# Patient Record
Sex: Female | Born: 1950 | Race: White | Hispanic: No | Marital: Married | Smoking: Never smoker
Health system: Southern US, Community
[De-identification: ages and names within clinical notes are randomized; demographics above are authoritative.]

---

## 1998-02-27 ENCOUNTER — Other Ambulatory Visit: Admission: RE | Admit: 1998-02-27 | Discharge: 1998-02-27 | Payer: Self-pay | Admitting: Gynecology

## 1998-09-22 ENCOUNTER — Other Ambulatory Visit: Admission: RE | Admit: 1998-09-22 | Discharge: 1998-09-22 | Payer: Self-pay | Admitting: Surgery

## 1999-02-26 ENCOUNTER — Encounter: Admission: RE | Admit: 1999-02-26 | Discharge: 1999-02-26 | Payer: Self-pay | Admitting: Internal Medicine

## 1999-02-26 ENCOUNTER — Encounter: Payer: Self-pay | Admitting: Internal Medicine

## 1999-11-16 ENCOUNTER — Encounter: Admission: RE | Admit: 1999-11-16 | Discharge: 1999-11-16 | Payer: Self-pay | Admitting: Internal Medicine

## 1999-11-16 ENCOUNTER — Encounter: Payer: Self-pay | Admitting: Internal Medicine

## 1999-11-23 ENCOUNTER — Encounter: Payer: Self-pay | Admitting: Neurosurgery

## 1999-11-23 ENCOUNTER — Inpatient Hospital Stay (HOSPITAL_COMMUNITY): Admission: RE | Admit: 1999-11-23 | Discharge: 1999-11-24 | Payer: Self-pay | Admitting: Neurosurgery

## 1999-12-10 ENCOUNTER — Encounter: Admission: RE | Admit: 1999-12-10 | Discharge: 1999-12-10 | Payer: Self-pay | Admitting: Neurosurgery

## 1999-12-10 ENCOUNTER — Encounter: Payer: Self-pay | Admitting: Neurosurgery

## 2000-01-28 ENCOUNTER — Encounter: Admission: RE | Admit: 2000-01-28 | Discharge: 2000-01-28 | Payer: Self-pay | Admitting: Neurosurgery

## 2000-01-28 ENCOUNTER — Encounter: Payer: Self-pay | Admitting: Neurosurgery

## 2000-07-02 ENCOUNTER — Other Ambulatory Visit: Admission: RE | Admit: 2000-07-02 | Discharge: 2000-07-02 | Payer: Self-pay | Admitting: Gynecology

## 2000-07-31 ENCOUNTER — Encounter: Admission: RE | Admit: 2000-07-31 | Discharge: 2000-07-31 | Payer: Self-pay | Admitting: Internal Medicine

## 2000-07-31 ENCOUNTER — Encounter: Payer: Self-pay | Admitting: Internal Medicine

## 2001-04-07 ENCOUNTER — Encounter: Admission: RE | Admit: 2001-04-07 | Discharge: 2001-04-07 | Payer: Self-pay | Admitting: Internal Medicine

## 2001-04-07 ENCOUNTER — Encounter: Payer: Self-pay | Admitting: Internal Medicine

## 2001-10-14 ENCOUNTER — Other Ambulatory Visit: Admission: RE | Admit: 2001-10-14 | Discharge: 2001-10-14 | Payer: Self-pay | Admitting: Gynecology

## 2003-02-09 ENCOUNTER — Other Ambulatory Visit: Admission: RE | Admit: 2003-02-09 | Discharge: 2003-02-09 | Payer: Self-pay | Admitting: Gynecology

## 2003-03-25 ENCOUNTER — Encounter: Admission: RE | Admit: 2003-03-25 | Discharge: 2003-03-25 | Payer: Self-pay | Admitting: Gynecology

## 2003-04-01 ENCOUNTER — Ambulatory Visit (HOSPITAL_COMMUNITY): Admission: RE | Admit: 2003-04-01 | Discharge: 2003-04-01 | Payer: Self-pay | Admitting: Gastroenterology

## 2003-09-29 ENCOUNTER — Ambulatory Visit (HOSPITAL_COMMUNITY): Admission: RE | Admit: 2003-09-29 | Discharge: 2003-09-29 | Payer: Self-pay | Admitting: Internal Medicine

## 2003-12-22 ENCOUNTER — Encounter: Admission: RE | Admit: 2003-12-22 | Discharge: 2003-12-22 | Payer: Self-pay | Admitting: Internal Medicine

## 2004-03-08 ENCOUNTER — Other Ambulatory Visit: Admission: RE | Admit: 2004-03-08 | Discharge: 2004-03-08 | Payer: Self-pay | Admitting: Gynecology

## 2004-05-26 ENCOUNTER — Encounter: Admission: RE | Admit: 2004-05-26 | Discharge: 2004-05-26 | Payer: Self-pay | Admitting: Internal Medicine

## 2004-06-19 ENCOUNTER — Encounter: Admission: RE | Admit: 2004-06-19 | Discharge: 2004-06-19 | Payer: Self-pay | Admitting: Internal Medicine

## 2004-06-27 ENCOUNTER — Encounter: Admission: RE | Admit: 2004-06-27 | Discharge: 2004-06-27 | Payer: Self-pay | Admitting: Internal Medicine

## 2005-04-02 ENCOUNTER — Other Ambulatory Visit: Admission: RE | Admit: 2005-04-02 | Discharge: 2005-04-02 | Payer: Self-pay | Admitting: Gynecology

## 2005-09-06 ENCOUNTER — Ambulatory Visit (HOSPITAL_BASED_OUTPATIENT_CLINIC_OR_DEPARTMENT_OTHER): Admission: RE | Admit: 2005-09-06 | Discharge: 2005-09-06 | Payer: Self-pay | Admitting: Gynecology

## 2005-09-06 ENCOUNTER — Encounter (INDEPENDENT_AMBULATORY_CARE_PROVIDER_SITE_OTHER): Payer: Self-pay | Admitting: Specialist

## 2006-05-27 ENCOUNTER — Encounter: Admission: RE | Admit: 2006-05-27 | Discharge: 2006-05-27 | Payer: Self-pay | Admitting: Gynecology

## 2006-05-29 ENCOUNTER — Other Ambulatory Visit: Admission: RE | Admit: 2006-05-29 | Discharge: 2006-05-29 | Payer: Self-pay | Admitting: Gynecology

## 2006-06-10 ENCOUNTER — Encounter: Admission: RE | Admit: 2006-06-10 | Discharge: 2006-06-10 | Payer: Self-pay | Admitting: Gynecology

## 2006-12-01 ENCOUNTER — Encounter: Admission: RE | Admit: 2006-12-01 | Discharge: 2006-12-01 | Payer: Self-pay | Admitting: Gynecology

## 2007-06-12 ENCOUNTER — Encounter: Admission: RE | Admit: 2007-06-12 | Discharge: 2007-06-12 | Payer: Self-pay | Admitting: Internal Medicine

## 2008-07-14 ENCOUNTER — Encounter: Admission: RE | Admit: 2008-07-14 | Discharge: 2008-07-14 | Payer: Self-pay | Admitting: Gynecology

## 2009-11-08 ENCOUNTER — Encounter: Admission: RE | Admit: 2009-11-08 | Discharge: 2009-11-08 | Payer: Self-pay | Admitting: Internal Medicine

## 2010-05-13 ENCOUNTER — Encounter: Payer: Self-pay | Admitting: Gynecology

## 2010-09-07 NOTE — Op Note (Signed)
[name], [name]                        ACCOUNT NO.:  [id number]   MEDICAL RECORD NO.:  [id number]                   PATIENT TYPE:  AMB   LOCATION:  ENDO                                 FACILITY:  [facility]   PHYSICIAN:  Danise Edge, M.D.                DATE OF BIRTH:  Apr 04, 1951   DATE OF PROCEDURE:  04/01/2003  DATE OF DISCHARGE:                                 OPERATIVE REPORT   PROCEDURE:  Esophagogastroduodenoscopy.   INDICATIONS:  Dana Randall is a 60 year old female, born September 23, 1950.  Mrs. Demo has chronic gastroesophageal reflux without dysphagia or  odynophagia.  She is started on proton pump inhibitor therapy to treat  heartburn.  On two occasions she has had blood regurgitate into her mouth.   ENDOSCOPIST:  Danise Edge, M.D.   PREMEDICATION:  Versed 6 mg, Demerol 50 mg.   PROCEDURE:  After obtaining informed consent, [name] was placed on the  left lateral decubitus position.  I administered intravenous Demerol and  intravenous Versed to achieve conscious sedation for the procedure.  The  patient's blood pressure, oxygen saturation and cardiac rhythm were  monitored throughout the procedure and documented in the medical record.   The Olympus gastroscope was passed through the posterior hypopharynx down to  the proximal esophagus without difficulty.  The hypopharynx and larynx  appeared normal.  I did not visualize the vocal cords.   ESOPHAGOSCOPY:  The proximal mid and lower segments of the esophageal mucosa  appear completely normal.  The squamocolumnar junction was noted at 35 cm  from the incisor teeth.   GASTROSCOPY:  [name] has a small to moderate sized hiatal hernia.  Retroflexed view of the gastric cardia and fundus was normal.  The  diaphragmatic hiatus was patulous.  The gastric body, antrum and pylorus  appeared normal.   DUODENOSCOPY:  The duodenal bulb, mid duodenum  and distal duodenum appeared  normal.   ASSESSMENT:   Gastroesophageal reflux (heartburn) associated with a hiatal  hernia and otherwise normal esophagogastroduodenoscopy.                                               Danise Edge, M.D.    MJ/MEDQ  D:  04/01/2003  T:  04/02/2003  Job:  [id number]

## 2010-09-07 NOTE — Op Note (Signed)
Dana Randall, Dana Randall              ACCOUNT NO.:  [id number]   MEDICAL RECORD NO.:  [id number]          PATIENT TYPE:  AMB   LOCATION:  [facility]                         FACILITY:  [facility]   PHYSICIAN:  Gretta Cool, M.D. DATE OF BIRTH:  1950-06-27   DATE OF PROCEDURE:  09/06/2005  DATE OF DISCHARGE:                                 OPERATIVE REPORT   PREOPERATIVE DIAGNOSES:  Endometrial polyp with abnormal uterine bleeding.   POSTOPERATIVE DIAGNOSES:  Endometrial polyp with abnormal uterine bleeding.   PROCEDURE:  Hysteroscopy, resection of endometrial polyp, and total  endometrial resection for ablation plus VaporTrode ablation.   SURGEON:  Dr. Nicholas Lose   ANESTHESIA:  IV sedation and paracervical block.   DESCRIPTION OF PROCEDURE:  Under excellent anesthesia as above with the  patient prepped and draped in Allen stirrups the cervix was progressively  dilated with a series of Pratt dilators to accommodate some 7 mm  resectoscope.  The entire endometrial cavity was then examined and  photographed.  A large endometrial polyp was identified in the left cornual  area.  The polyp was resected first and submitted separately.  The entire  endometrial cavity was then resected so as to remove all of the endometrial  tissue down to a distance of 5 mm or so into the myometrium.  At this point  the entire cavity was treated with VaporTrode electrode at 200 watts pure  cut so as to eliminate any islands of endometrial tissue viable in  superficial myometrium.  At this point procedure was terminated without  complication.  Patient returned to the recovery room in excellent condition.           ______________________________  Gretta Cool, M.D.     CWL/MEDQ  D:  09/06/2005  T:  09/06/2005  Job:  [id number]   cc:   Thora Lance, M.D.  Fax: [phone]

## 2010-11-07 ENCOUNTER — Other Ambulatory Visit: Payer: Self-pay | Admitting: Gynecology

## 2010-11-07 DIAGNOSIS — Z1231 Encounter for screening mammogram for malignant neoplasm of breast: Secondary | ICD-10-CM

## 2010-11-16 ENCOUNTER — Ambulatory Visit
Admission: RE | Admit: 2010-11-16 | Discharge: 2010-11-16 | Disposition: A | Discharge status: Home / Self Care | Payer: 59 | Source: Ambulatory Visit | Attending: Gynecology | Admitting: Gynecology

## 2010-11-16 DIAGNOSIS — Z1231 Encounter for screening mammogram for malignant neoplasm of breast: Secondary | ICD-10-CM

## 2010-12-14 ENCOUNTER — Encounter (INDEPENDENT_AMBULATORY_CARE_PROVIDER_SITE_OTHER): Payer: 59 | Admitting: Ophthalmology

## 2011-04-09 ENCOUNTER — Other Ambulatory Visit: Payer: Self-pay | Admitting: Gynecology

## 2011-12-17 ENCOUNTER — Other Ambulatory Visit (HOSPITAL_COMMUNITY): Payer: Self-pay | Admitting: General Surgery

## 2011-12-17 DIAGNOSIS — C439 Malignant melanoma of skin, unspecified: Secondary | ICD-10-CM

## 2011-12-20 ENCOUNTER — Other Ambulatory Visit (HOSPITAL_COMMUNITY): Payer: Self-pay | Admitting: General Surgery

## 2011-12-20 DIAGNOSIS — C439 Malignant melanoma of skin, unspecified: Secondary | ICD-10-CM

## 2011-12-27 ENCOUNTER — Other Ambulatory Visit (HOSPITAL_COMMUNITY): Payer: 59

## 2011-12-30 ENCOUNTER — Ambulatory Visit (HOSPITAL_COMMUNITY)
Admission: RE | Admit: 2011-12-30 | Discharge: 2011-12-30 | Disposition: A | Discharge status: Home / Self Care | Payer: 59 | Source: Ambulatory Visit | Attending: General Surgery | Admitting: General Surgery

## 2011-12-30 DIAGNOSIS — C439 Malignant melanoma of skin, unspecified: Secondary | ICD-10-CM | POA: Insufficient documentation

## 2011-12-30 DIAGNOSIS — I2510 Atherosclerotic heart disease of native coronary artery without angina pectoris: Secondary | ICD-10-CM | POA: Insufficient documentation

## 2011-12-30 DIAGNOSIS — R911 Solitary pulmonary nodule: Secondary | ICD-10-CM | POA: Insufficient documentation

## 2011-12-30 MED ORDER — IOHEXOL 300 MG/ML  SOLN
100.0000 mL | Freq: Once | INTRAMUSCULAR | Status: AC | PRN
  Administered 2011-12-30: 100 mL via INTRAVENOUS

## 2012-01-09 ENCOUNTER — Other Ambulatory Visit: Payer: Self-pay | Admitting: Gynecology

## 2012-01-09 DIAGNOSIS — Z1231 Encounter for screening mammogram for malignant neoplasm of breast: Secondary | ICD-10-CM

## 2012-01-15 ENCOUNTER — Ambulatory Visit
Admission: RE | Admit: 2012-01-15 | Discharge: 2012-01-15 | Disposition: A | Discharge status: Home / Self Care | Payer: 59 | Source: Ambulatory Visit | Attending: Gynecology | Admitting: Gynecology

## 2012-01-15 DIAGNOSIS — Z1231 Encounter for screening mammogram for malignant neoplasm of breast: Secondary | ICD-10-CM

## 2013-01-04 ENCOUNTER — Other Ambulatory Visit: Payer: Self-pay

## 2013-01-04 DIAGNOSIS — Z1231 Encounter for screening mammogram for malignant neoplasm of breast: Secondary | ICD-10-CM

## 2013-01-26 ENCOUNTER — Ambulatory Visit
Admission: RE | Admit: 2013-01-26 | Discharge: 2013-01-26 | Disposition: A | Discharge status: Home / Self Care | Payer: 59 | Source: Ambulatory Visit

## 2013-01-26 DIAGNOSIS — Z1231 Encounter for screening mammogram for malignant neoplasm of breast: Secondary | ICD-10-CM

## 2013-03-25 ENCOUNTER — Other Ambulatory Visit: Payer: Self-pay | Admitting: Orthopedic Surgery

## 2013-03-25 DIAGNOSIS — M25512 Pain in left shoulder: Secondary | ICD-10-CM

## 2013-04-06 ENCOUNTER — Ambulatory Visit
Admission: RE | Admit: 2013-04-06 | Discharge: 2013-04-06 | Disposition: A | Discharge status: Home / Self Care | Payer: Worker's Compensation | Source: Ambulatory Visit | Attending: Orthopedic Surgery | Admitting: Orthopedic Surgery

## 2013-04-06 DIAGNOSIS — M25512 Pain in left shoulder: Secondary | ICD-10-CM

## 2013-04-06 MED ORDER — IOHEXOL 180 MG/ML  SOLN
15.0000 mL | Freq: Once | INTRAMUSCULAR | Status: AC | PRN
  Administered 2013-04-06: 15 mL via INTRA_ARTICULAR

## 2013-12-29 ENCOUNTER — Other Ambulatory Visit: Payer: Self-pay

## 2013-12-29 DIAGNOSIS — Z1231 Encounter for screening mammogram for malignant neoplasm of breast: Secondary | ICD-10-CM

## 2014-02-01 ENCOUNTER — Ambulatory Visit
Admission: RE | Admit: 2014-02-01 | Discharge: 2014-02-01 | Disposition: A | Discharge status: Home / Self Care | Payer: 59 | Source: Ambulatory Visit

## 2014-02-01 DIAGNOSIS — Z1231 Encounter for screening mammogram for malignant neoplasm of breast: Secondary | ICD-10-CM

## 2014-10-27 DIAGNOSIS — H4011X1 Primary open-angle glaucoma, mild stage: Secondary | ICD-10-CM | POA: Diagnosis not present

## 2014-10-28 DIAGNOSIS — H3532 Exudative age-related macular degeneration: Secondary | ICD-10-CM | POA: Diagnosis not present

## 2014-12-13 DIAGNOSIS — C439 Malignant melanoma of skin, unspecified: Secondary | ICD-10-CM | POA: Insufficient documentation

## 2014-12-13 DIAGNOSIS — C799 Secondary malignant neoplasm of unspecified site: Secondary | ICD-10-CM | POA: Diagnosis not present

## 2014-12-30 DIAGNOSIS — H3532 Exudative age-related macular degeneration: Secondary | ICD-10-CM | POA: Diagnosis not present

## 2015-01-19 ENCOUNTER — Other Ambulatory Visit: Payer: Self-pay

## 2015-01-19 DIAGNOSIS — Z1231 Encounter for screening mammogram for malignant neoplasm of breast: Secondary | ICD-10-CM

## 2015-02-07 DIAGNOSIS — Z1389 Encounter for screening for other disorder: Secondary | ICD-10-CM | POA: Diagnosis not present

## 2015-02-07 DIAGNOSIS — Z6831 Body mass index (BMI) 31.0-31.9, adult: Secondary | ICD-10-CM | POA: Diagnosis not present

## 2015-02-07 DIAGNOSIS — M25569 Pain in unspecified knee: Secondary | ICD-10-CM | POA: Diagnosis not present

## 2015-02-07 DIAGNOSIS — E669 Obesity, unspecified: Secondary | ICD-10-CM | POA: Diagnosis not present

## 2015-02-07 DIAGNOSIS — K219 Gastro-esophageal reflux disease without esophagitis: Secondary | ICD-10-CM | POA: Diagnosis not present

## 2015-02-07 DIAGNOSIS — E7800 Pure hypercholesterolemia, unspecified: Secondary | ICD-10-CM | POA: Diagnosis not present

## 2015-02-07 DIAGNOSIS — R739 Hyperglycemia, unspecified: Secondary | ICD-10-CM | POA: Diagnosis not present

## 2015-02-07 DIAGNOSIS — Z23 Encounter for immunization: Secondary | ICD-10-CM | POA: Diagnosis not present

## 2015-02-14 ENCOUNTER — Ambulatory Visit
Admission: RE | Admit: 2015-02-14 | Discharge: 2015-02-14 | Disposition: A | Discharge status: Home / Self Care | Payer: Medicare Other | Source: Ambulatory Visit

## 2015-02-14 DIAGNOSIS — Z1231 Encounter for screening mammogram for malignant neoplasm of breast: Secondary | ICD-10-CM | POA: Diagnosis not present

## 2015-03-08 DIAGNOSIS — H353133 Nonexudative age-related macular degeneration, bilateral, advanced atrophic without subfoveal involvement: Secondary | ICD-10-CM | POA: Diagnosis not present

## 2015-03-08 DIAGNOSIS — H353221 Exudative age-related macular degeneration, left eye, with active choroidal neovascularization: Secondary | ICD-10-CM | POA: Diagnosis not present

## 2015-03-08 DIAGNOSIS — H4421 Degenerative myopia, right eye: Secondary | ICD-10-CM | POA: Diagnosis not present

## 2015-05-08 DIAGNOSIS — H401111 Primary open-angle glaucoma, right eye, mild stage: Secondary | ICD-10-CM | POA: Diagnosis not present

## 2015-05-17 DIAGNOSIS — H353221 Exudative age-related macular degeneration, left eye, with active choroidal neovascularization: Secondary | ICD-10-CM | POA: Diagnosis not present

## 2015-06-08 DIAGNOSIS — Z1283 Encounter for screening for malignant neoplasm of skin: Secondary | ICD-10-CM | POA: Diagnosis not present

## 2015-06-08 DIAGNOSIS — Z08 Encounter for follow-up examination after completed treatment for malignant neoplasm: Secondary | ICD-10-CM | POA: Diagnosis not present

## 2015-06-08 DIAGNOSIS — Z85820 Personal history of malignant melanoma of skin: Secondary | ICD-10-CM | POA: Diagnosis not present

## 2015-06-08 DIAGNOSIS — L219 Seborrheic dermatitis, unspecified: Secondary | ICD-10-CM | POA: Diagnosis not present

## 2015-08-15 DIAGNOSIS — Z6828 Body mass index (BMI) 28.0-28.9, adult: Secondary | ICD-10-CM | POA: Diagnosis not present

## 2015-08-15 DIAGNOSIS — N9089 Other specified noninflammatory disorders of vulva and perineum: Secondary | ICD-10-CM | POA: Diagnosis not present

## 2015-08-16 DIAGNOSIS — H353221 Exudative age-related macular degeneration, left eye, with active choroidal neovascularization: Secondary | ICD-10-CM | POA: Diagnosis not present

## 2015-08-16 DIAGNOSIS — H353133 Nonexudative age-related macular degeneration, bilateral, advanced atrophic without subfoveal involvement: Secondary | ICD-10-CM | POA: Diagnosis not present

## 2015-09-13 DIAGNOSIS — R739 Hyperglycemia, unspecified: Secondary | ICD-10-CM | POA: Diagnosis not present

## 2015-09-13 DIAGNOSIS — Z79899 Other long term (current) drug therapy: Secondary | ICD-10-CM | POA: Diagnosis not present

## 2015-09-13 DIAGNOSIS — Z0000 Encounter for general adult medical examination without abnormal findings: Secondary | ICD-10-CM | POA: Diagnosis not present

## 2015-09-13 DIAGNOSIS — E669 Obesity, unspecified: Secondary | ICD-10-CM | POA: Diagnosis not present

## 2015-09-13 DIAGNOSIS — Z1389 Encounter for screening for other disorder: Secondary | ICD-10-CM | POA: Diagnosis not present

## 2015-09-13 DIAGNOSIS — K219 Gastro-esophageal reflux disease without esophagitis: Secondary | ICD-10-CM | POA: Diagnosis not present

## 2015-09-13 DIAGNOSIS — Z23 Encounter for immunization: Secondary | ICD-10-CM | POA: Diagnosis not present

## 2015-09-13 DIAGNOSIS — Z6828 Body mass index (BMI) 28.0-28.9, adult: Secondary | ICD-10-CM | POA: Diagnosis not present

## 2015-09-13 DIAGNOSIS — E7800 Pure hypercholesterolemia, unspecified: Secondary | ICD-10-CM | POA: Diagnosis not present

## 2015-09-13 DIAGNOSIS — R7301 Impaired fasting glucose: Secondary | ICD-10-CM | POA: Diagnosis not present

## 2015-10-26 DIAGNOSIS — H401111 Primary open-angle glaucoma, right eye, mild stage: Secondary | ICD-10-CM | POA: Diagnosis not present

## 2015-10-26 DIAGNOSIS — Z0100 Encounter for examination of eyes and vision without abnormal findings: Secondary | ICD-10-CM | POA: Diagnosis not present

## 2015-10-26 DIAGNOSIS — H2513 Age-related nuclear cataract, bilateral: Secondary | ICD-10-CM | POA: Diagnosis not present

## 2015-12-12 DIAGNOSIS — C799 Secondary malignant neoplasm of unspecified site: Secondary | ICD-10-CM | POA: Diagnosis not present

## 2015-12-20 DIAGNOSIS — H35363 Drusen (degenerative) of macula, bilateral: Secondary | ICD-10-CM | POA: Diagnosis not present

## 2015-12-20 DIAGNOSIS — H4421 Degenerative myopia, right eye: Secondary | ICD-10-CM | POA: Diagnosis not present

## 2015-12-20 DIAGNOSIS — H353222 Exudative age-related macular degeneration, left eye, with inactive choroidal neovascularization: Secondary | ICD-10-CM | POA: Diagnosis not present

## 2015-12-20 DIAGNOSIS — H353133 Nonexudative age-related macular degeneration, bilateral, advanced atrophic without subfoveal involvement: Secondary | ICD-10-CM | POA: Diagnosis not present

## 2016-02-06 ENCOUNTER — Other Ambulatory Visit: Payer: Self-pay | Admitting: Internal Medicine

## 2016-02-06 DIAGNOSIS — Z1231 Encounter for screening mammogram for malignant neoplasm of breast: Secondary | ICD-10-CM

## 2016-02-12 DIAGNOSIS — Z1211 Encounter for screening for malignant neoplasm of colon: Secondary | ICD-10-CM | POA: Diagnosis not present

## 2016-02-12 DIAGNOSIS — K635 Polyp of colon: Secondary | ICD-10-CM | POA: Diagnosis not present

## 2016-02-12 DIAGNOSIS — D120 Benign neoplasm of cecum: Secondary | ICD-10-CM | POA: Diagnosis not present

## 2016-02-15 DIAGNOSIS — Z1211 Encounter for screening for malignant neoplasm of colon: Secondary | ICD-10-CM | POA: Diagnosis not present

## 2016-02-15 DIAGNOSIS — K635 Polyp of colon: Secondary | ICD-10-CM | POA: Diagnosis not present

## 2016-02-19 ENCOUNTER — Ambulatory Visit (INDEPENDENT_AMBULATORY_CARE_PROVIDER_SITE_OTHER): Payer: Medicare Other

## 2016-02-19 ENCOUNTER — Encounter (INDEPENDENT_AMBULATORY_CARE_PROVIDER_SITE_OTHER): Payer: Self-pay | Admitting: Orthopedic Surgery

## 2016-02-19 ENCOUNTER — Ambulatory Visit (INDEPENDENT_AMBULATORY_CARE_PROVIDER_SITE_OTHER): Payer: Medicare Other | Admitting: Orthopedic Surgery

## 2016-02-19 DIAGNOSIS — G8929 Other chronic pain: Secondary | ICD-10-CM

## 2016-02-19 DIAGNOSIS — M25511 Pain in right shoulder: Secondary | ICD-10-CM

## 2016-02-19 NOTE — Progress Notes (Addendum)
Office Visit Note   Patient: Dana Randall           Date of Birth: Aug 23, 1950           MRN: [id number] Visit Date: 02/19/2016 Requested by: Lavone Orn, MD [address] [place], [place] [address] PCP: Irven Shelling, MD  Subjective: Chief Complaint  Patient presents with  . Right Shoulder - Pain  Dana Randall is a 65 year old female with a four-month history of right shoulder pain.  Notably she had traumatic left shoulder rotator cuff rupture repaired with good result 2 years ago.  On the right shoulder she started doing some exercises in June and developed pain which is currently preventing her from abduction without any thickened symptoms.  She states that she was working on some band work that day in the next day it was very painful been taking ibuprofen and Tylenol.  In waking her from sleep for the past 2 months.  Denies any frank weakness but does report pain in the deltoid region.  Denies any neck symptoms or radicular symptoms on the right-hand side  Patient comes in complaining of right shoulder pain since first of Jun 2017.  She went for a physical and was told to exercise/strength train some.  That night she did exercise bands, and the next day she could not lift up and out with the right shoulder.  She can reach behind, raise overhead, but cannot lift outward.  She has trouble sleeping, the pain wakes her at times. She takes tylenol now, was taking ibuprofen also, but had a colonoscopy last week, and has had to d/c ibuprofen.  Has used heat.  Pain is at front of shoulder and radiates down arm to elbow.                  Review of Systems all systems reviewed negatives a related to the chief complaint.  No fevers or chills.   Assessment & Plan: Visit Diagnoses:  1. Chronic right shoulder pain     Plan: Impression is right shoulder pain four-month duration with history of left shoulder rotator cuff repair.  Plan I think this likely represents overuse versus  bursitis.  Rotator cuff and biceps pathology is also possible.  It is waking her from sleep and she has done activity modification along with medication.  Plan at this time is for MRI arthrogram of the right shoulder to evaluate for structural problem.  We'll see her back after that study  Follow-Up Instructions: Return for after MRI.   Orders:  Orders Placed This Encounter  Procedures  . XR Shoulder Right  . Arthrogram  . MR Shoulder Right w/ contrast   No orders of the defined types were placed in this encounter.     Procedures: No procedures performed   Clinical Data: No additional findings.  Objective: Vital Signs: There were no vitals taken for this visit.  Physical Exam  Constitutional: She appears well-developed.  HENT:  Head: Normocephalic.  Eyes: EOM are normal.  Neck: Normal range of motion.  Cardiovascular: Normal rate.   Pulmonary/Chest: Effort normal.  Neurological: She is alert.  Skin: Skin is warm.  Psychiatric: She has a normal mood and affect.    Ortho Exam right shoulder exam demonstrates full active and passive range of motion but she does have some pain with abduction.  I do not detect a lot of course grinding or crepitus with active or passive motion both at shoulder level as well as  with abduction.  There is no restriction of external rotation at 15 of abduction bilaterally.  No other masses lymph adenopathy or skin changes noted in the shoulder girdle region.  Negative O'Brien's testing negative speed's testing positive impingement testing no before meals joint tenderness on the right or left  Specialty Comments:  No specialty comments available.  Imaging: Xr Shoulder Right  Result Date: 02/19/2016 3 views right shoulder ordered and obtained shows maintenance of the acromiohumeral distance as well as the glenoid or humeral distance.  Before meals joint mild degenerative changes cervical spine plate in position.  Type 1-2 acromion  present.    PMFS History: There are no active problems to display for this patient.  No past medical history on file.  No family history on file.  No past surgical history on file. Social History   Occupational History  . Not on file.   Social History Main Topics  . Smoking status: Never Smoker  . Smokeless tobacco: Never Used  . Alcohol use No  . Drug use: No  . Sexual activity: Not on file

## 2016-03-01 ENCOUNTER — Ambulatory Visit
Admission: RE | Admit: 2016-03-01 | Discharge: 2016-03-01 | Disposition: A | Discharge status: Home / Self Care | Payer: Medicare Other | Source: Ambulatory Visit | Attending: Internal Medicine | Admitting: Internal Medicine

## 2016-03-01 DIAGNOSIS — Z1231 Encounter for screening mammogram for malignant neoplasm of breast: Secondary | ICD-10-CM | POA: Diagnosis not present

## 2016-03-01 DIAGNOSIS — Z23 Encounter for immunization: Secondary | ICD-10-CM | POA: Diagnosis not present

## 2016-03-25 ENCOUNTER — Ambulatory Visit
Admission: RE | Admit: 2016-03-25 | Discharge: 2016-03-25 | Disposition: A | Discharge status: Home / Self Care | Payer: Medicare Other | Source: Ambulatory Visit | Attending: Orthopedic Surgery | Admitting: Orthopedic Surgery

## 2016-03-25 DIAGNOSIS — G8929 Other chronic pain: Secondary | ICD-10-CM

## 2016-03-25 DIAGNOSIS — M25511 Pain in right shoulder: Principal | ICD-10-CM

## 2016-03-25 DIAGNOSIS — M7581 Other shoulder lesions, right shoulder: Secondary | ICD-10-CM | POA: Diagnosis not present

## 2016-03-25 MED ORDER — IOPAMIDOL (ISOVUE-M 200) INJECTION 41%
15.0000 mL | Freq: Once | INTRAMUSCULAR | Status: AC
  Administered 2016-03-25: 15 mL via INTRA_ARTICULAR

## 2016-03-27 ENCOUNTER — Ambulatory Visit (INDEPENDENT_AMBULATORY_CARE_PROVIDER_SITE_OTHER): Payer: Medicare Other | Admitting: Orthopedic Surgery

## 2016-04-01 ENCOUNTER — Ambulatory Visit (INDEPENDENT_AMBULATORY_CARE_PROVIDER_SITE_OTHER): Payer: Medicare Other | Admitting: Orthopedic Surgery

## 2016-04-01 ENCOUNTER — Encounter (INDEPENDENT_AMBULATORY_CARE_PROVIDER_SITE_OTHER): Payer: Self-pay | Admitting: Orthopedic Surgery

## 2016-04-01 DIAGNOSIS — M25511 Pain in right shoulder: Secondary | ICD-10-CM

## 2016-04-01 DIAGNOSIS — G8929 Other chronic pain: Secondary | ICD-10-CM

## 2016-04-01 MED ORDER — BUPIVACAINE HCL 0.5 % IJ SOLN
9.0000 mL | INTRAMUSCULAR | Status: AC | PRN
  Administered 2016-04-01: 9 mL via INTRA_ARTICULAR

## 2016-04-01 MED ORDER — LIDOCAINE HCL 1 % IJ SOLN
5.0000 mL | INTRAMUSCULAR | Status: AC | PRN
  Administered 2016-04-01: 5 mL

## 2016-04-01 MED ORDER — METHYLPREDNISOLONE ACETATE 40 MG/ML IJ SUSP
40.0000 mg | INTRAMUSCULAR | Status: AC | PRN
  Administered 2016-04-01: 40 mg via INTRA_ARTICULAR

## 2016-04-01 NOTE — Progress Notes (Signed)
Office Visit Note   Patient: Dana Randall           Date of Birth: May 10, 1950           MRN: [id number] Visit Date: 04/01/2016 Requested by: Lavone Orn, MD [address] [place], [place] [address] PCP: Irven Shelling, MD  Subjective: Chief Complaint  Patient presents with  . Right Shoulder - Pain    HPI Dana Randall is a 65 year old patient with right shoulder pain.  Since of [place] she's had an MRI scan which is reviewed.  Interestingly she has some before meals joint arthritis but also partial-thickness rotator cuff tearing and a cyst off of the attachment site of the supraspinatus.  I think this may be giving her some of her impingement signs.  She describes persistent symptoms in the left shoulder which she can more or less live with but she does want to become a little bit more active.  Does tend to bother her at night sometimes.              Review of Systems All systems reviewed are negative as they relate to the chief complaint within the history of present illness.  Patient denies  fevers or chills.    Assessment & Plan: Visit Diagnoses:  1. 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  Plan is subacromial injection today.  I did look at the cyst under ultrasound think it is in the location that it could be symptomatic.  Plan at this time is to see how the injection works and for her to consider how bad this has to get clinically to warrant surgical decompression and debridement.  I'll see her back as needed -currently no definitive surgical indication at this time unless it becomes much more symptomatic for the patient  Follow-Up Instructions: Return if symptoms worsen or fail to improve.   Orders:  No orders of the defined types were placed in this  encounter.  No orders of the defined types were placed in this encounter.     Procedures: Large Joint Inj Date/Time: 04/01/2016 5:47 PM Performed by: Meredith Pel Authorized by: Meredith Pel   Consent Given by:  Patient Site marked: the procedure site was marked   Timeout: prior to procedure the correct patient, procedure, and site was verified   Indications:  Pain and diagnostic evaluation Location:  Shoulder Site:  R subacromial bursa Prep: patient was prepped and draped in usual sterile fashion   Needle Size:  18 G Needle Length:  1.5 inches Approach:  Posterior Ultrasound Guidance: No   Fluoroscopic Guidance: No   Arthrogram: No   Medications:  5 mL lidocaine 1 %; 9 mL bupivacaine 0.5 %; 40 mg methylPREDNISolone acetate 40 MG/ML Aspiration Attempted: No   Patient tolerance:  Patient tolerated the procedure well with no immediate complications     Clinical Data: No additional findings.  Objective: Vital Signs: There were no vitals taken for this visit.  Physical Exam   Constitutional: Patient appears well-developed HEENT:  Head: Normocephalic Eyes:EOM are normal Neck: Normal range of motion Cardiovascular: Normal rate Pulmonary/chest: Effort normal Neurologic: Patient is alert Skin: Skin is warm Psychiatric: Patient has normal mood and affect    Ortho Exam examination of the right shoulder demonstrates full active and  passive range of motion but with some pain with abduction.  O'Brien's testing negative negative apprehension relocation testing no other masses lymph adenopathy or skin vision noted in the right shoulder girdle region.  No real asymmetric before meals joint tenderness to direct palpation or with crossarm adduction.  Specialty Comments:  No specialty comments available.  Imaging: No results found.   PMFS History: There are no active problems to display for this patient.  No past medical history on file.  No family history  on file.  No past surgical history on file. Social History   Occupational History  . Not on file.   Social History Main Topics  . Smoking status: Never Smoker  . Smokeless tobacco: Never Used  . Alcohol use No  . Drug use: No  . Sexual activity: Not on file

## 2016-05-03 DIAGNOSIS — H401111 Primary open-angle glaucoma, right eye, mild stage: Secondary | ICD-10-CM | POA: Diagnosis not present

## 2016-06-14 DIAGNOSIS — Z08 Encounter for follow-up examination after completed treatment for malignant neoplasm: Secondary | ICD-10-CM | POA: Diagnosis not present

## 2016-06-14 DIAGNOSIS — Z1283 Encounter for screening for malignant neoplasm of skin: Secondary | ICD-10-CM | POA: Diagnosis not present

## 2016-06-14 DIAGNOSIS — Z85820 Personal history of malignant melanoma of skin: Secondary | ICD-10-CM | POA: Diagnosis not present

## 2016-06-14 DIAGNOSIS — L820 Inflamed seborrheic keratosis: Secondary | ICD-10-CM | POA: Diagnosis not present

## 2016-09-17 DIAGNOSIS — E669 Obesity, unspecified: Secondary | ICD-10-CM | POA: Diagnosis not present

## 2016-09-17 DIAGNOSIS — Z6826 Body mass index (BMI) 26.0-26.9, adult: Secondary | ICD-10-CM | POA: Diagnosis not present

## 2016-09-17 DIAGNOSIS — Z13820 Encounter for screening for osteoporosis: Secondary | ICD-10-CM | POA: Diagnosis not present

## 2016-09-17 DIAGNOSIS — E7800 Pure hypercholesterolemia, unspecified: Secondary | ICD-10-CM | POA: Diagnosis not present

## 2016-09-17 DIAGNOSIS — R7301 Impaired fasting glucose: Secondary | ICD-10-CM | POA: Diagnosis not present

## 2016-09-17 DIAGNOSIS — Z0000 Encounter for general adult medical examination without abnormal findings: Secondary | ICD-10-CM | POA: Diagnosis not present

## 2016-09-17 DIAGNOSIS — Z23 Encounter for immunization: Secondary | ICD-10-CM | POA: Diagnosis not present

## 2016-09-17 DIAGNOSIS — K219 Gastro-esophageal reflux disease without esophagitis: Secondary | ICD-10-CM | POA: Diagnosis not present

## 2016-09-23 DIAGNOSIS — E7800 Pure hypercholesterolemia, unspecified: Secondary | ICD-10-CM | POA: Insufficient documentation

## 2016-09-23 DIAGNOSIS — S8982XA Other specified injuries of left lower leg, initial encounter: Secondary | ICD-10-CM | POA: Diagnosis not present

## 2016-09-23 DIAGNOSIS — T148XXA Other injury of unspecified body region, initial encounter: Secondary | ICD-10-CM | POA: Diagnosis not present

## 2016-09-26 DIAGNOSIS — S81802A Unspecified open wound, left lower leg, initial encounter: Secondary | ICD-10-CM | POA: Diagnosis not present

## 2016-09-26 DIAGNOSIS — S8012XA Contusion of left lower leg, initial encounter: Secondary | ICD-10-CM | POA: Diagnosis not present

## 2016-10-11 DIAGNOSIS — H2513 Age-related nuclear cataract, bilateral: Secondary | ICD-10-CM | POA: Diagnosis not present

## 2016-10-11 DIAGNOSIS — H401111 Primary open-angle glaucoma, right eye, mild stage: Secondary | ICD-10-CM | POA: Diagnosis not present

## 2016-10-11 DIAGNOSIS — H5213 Myopia, bilateral: Secondary | ICD-10-CM | POA: Diagnosis not present

## 2016-10-11 DIAGNOSIS — H353233 Exudative age-related macular degeneration, bilateral, with inactive scar: Secondary | ICD-10-CM | POA: Diagnosis not present

## 2016-10-14 DIAGNOSIS — H353124 Nonexudative age-related macular degeneration, left eye, advanced atrophic with subfoveal involvement: Secondary | ICD-10-CM | POA: Diagnosis not present

## 2016-10-14 DIAGNOSIS — H353112 Nonexudative age-related macular degeneration, right eye, intermediate dry stage: Secondary | ICD-10-CM | POA: Diagnosis not present

## 2016-10-14 DIAGNOSIS — H353222 Exudative age-related macular degeneration, left eye, with inactive choroidal neovascularization: Secondary | ICD-10-CM | POA: Diagnosis not present

## 2016-10-14 DIAGNOSIS — H353133 Nonexudative age-related macular degeneration, bilateral, advanced atrophic without subfoveal involvement: Secondary | ICD-10-CM | POA: Diagnosis not present

## 2016-10-14 DIAGNOSIS — H35722 Serous detachment of retinal pigment epithelium, left eye: Secondary | ICD-10-CM | POA: Diagnosis not present

## 2016-10-15 DIAGNOSIS — H35722 Serous detachment of retinal pigment epithelium, left eye: Secondary | ICD-10-CM | POA: Diagnosis not present

## 2016-10-15 DIAGNOSIS — H353222 Exudative age-related macular degeneration, left eye, with inactive choroidal neovascularization: Secondary | ICD-10-CM | POA: Diagnosis not present

## 2016-10-15 DIAGNOSIS — H353133 Nonexudative age-related macular degeneration, bilateral, advanced atrophic without subfoveal involvement: Secondary | ICD-10-CM | POA: Diagnosis not present

## 2016-12-10 DIAGNOSIS — C4372 Malignant melanoma of left lower limb, including hip: Secondary | ICD-10-CM | POA: Diagnosis not present

## 2016-12-17 DIAGNOSIS — H4421 Degenerative myopia, right eye: Secondary | ICD-10-CM | POA: Diagnosis not present

## 2016-12-17 DIAGNOSIS — H3562 Retinal hemorrhage, left eye: Secondary | ICD-10-CM | POA: Diagnosis not present

## 2016-12-17 DIAGNOSIS — H35722 Serous detachment of retinal pigment epithelium, left eye: Secondary | ICD-10-CM | POA: Diagnosis not present

## 2016-12-17 DIAGNOSIS — H353221 Exudative age-related macular degeneration, left eye, with active choroidal neovascularization: Secondary | ICD-10-CM | POA: Diagnosis not present

## 2016-12-17 DIAGNOSIS — H353133 Nonexudative age-related macular degeneration, bilateral, advanced atrophic without subfoveal involvement: Secondary | ICD-10-CM | POA: Diagnosis not present

## 2017-01-09 DIAGNOSIS — Z780 Asymptomatic menopausal state: Secondary | ICD-10-CM | POA: Diagnosis not present

## 2017-01-27 DIAGNOSIS — H3562 Retinal hemorrhage, left eye: Secondary | ICD-10-CM | POA: Diagnosis not present

## 2017-01-27 DIAGNOSIS — H353133 Nonexudative age-related macular degeneration, bilateral, advanced atrophic without subfoveal involvement: Secondary | ICD-10-CM | POA: Diagnosis not present

## 2017-01-27 DIAGNOSIS — H35722 Serous detachment of retinal pigment epithelium, left eye: Secondary | ICD-10-CM | POA: Diagnosis not present

## 2017-01-27 DIAGNOSIS — H353221 Exudative age-related macular degeneration, left eye, with active choroidal neovascularization: Secondary | ICD-10-CM | POA: Diagnosis not present

## 2017-02-10 DIAGNOSIS — Z23 Encounter for immunization: Secondary | ICD-10-CM | POA: Diagnosis not present

## 2017-03-04 DIAGNOSIS — H35731 Hemorrhagic detachment of retinal pigment epithelium, right eye: Secondary | ICD-10-CM | POA: Diagnosis not present

## 2017-03-04 DIAGNOSIS — H35022 Exudative retinopathy, left eye: Secondary | ICD-10-CM | POA: Diagnosis not present

## 2017-03-04 DIAGNOSIS — H353221 Exudative age-related macular degeneration, left eye, with active choroidal neovascularization: Secondary | ICD-10-CM | POA: Diagnosis not present

## 2017-03-04 DIAGNOSIS — H35722 Serous detachment of retinal pigment epithelium, left eye: Secondary | ICD-10-CM | POA: Diagnosis not present

## 2017-04-09 ENCOUNTER — Other Ambulatory Visit: Payer: Self-pay | Admitting: Internal Medicine

## 2017-04-09 DIAGNOSIS — Z1231 Encounter for screening mammogram for malignant neoplasm of breast: Secondary | ICD-10-CM

## 2017-04-10 DIAGNOSIS — H401111 Primary open-angle glaucoma, right eye, mild stage: Secondary | ICD-10-CM | POA: Diagnosis not present

## 2017-04-16 ENCOUNTER — Ambulatory Visit
Admission: RE | Admit: 2017-04-16 | Discharge: 2017-04-16 | Disposition: A | Discharge status: Home / Self Care | Payer: Medicare Other | Source: Ambulatory Visit | Attending: Internal Medicine | Admitting: Internal Medicine

## 2017-04-16 DIAGNOSIS — Z1231 Encounter for screening mammogram for malignant neoplasm of breast: Secondary | ICD-10-CM

## 2017-04-17 ENCOUNTER — Other Ambulatory Visit: Payer: Self-pay | Admitting: Internal Medicine

## 2017-04-17 DIAGNOSIS — R928 Other abnormal and inconclusive findings on diagnostic imaging of breast: Secondary | ICD-10-CM

## 2017-04-24 ENCOUNTER — Ambulatory Visit
Admission: RE | Admit: 2017-04-24 | Discharge: 2017-04-24 | Disposition: A | Discharge status: Home / Self Care | Payer: Medicare Other | Source: Ambulatory Visit | Attending: Internal Medicine | Admitting: Internal Medicine

## 2017-04-24 DIAGNOSIS — R928 Other abnormal and inconclusive findings on diagnostic imaging of breast: Secondary | ICD-10-CM | POA: Diagnosis not present

## 2017-04-24 DIAGNOSIS — N6310 Unspecified lump in the right breast, unspecified quadrant: Secondary | ICD-10-CM | POA: Diagnosis not present

## 2017-05-23 DIAGNOSIS — H35722 Serous detachment of retinal pigment epithelium, left eye: Secondary | ICD-10-CM | POA: Diagnosis not present

## 2017-05-23 DIAGNOSIS — H35731 Hemorrhagic detachment of retinal pigment epithelium, right eye: Secondary | ICD-10-CM | POA: Diagnosis not present

## 2017-05-23 DIAGNOSIS — H3562 Retinal hemorrhage, left eye: Secondary | ICD-10-CM | POA: Diagnosis not present

## 2017-05-23 DIAGNOSIS — H353221 Exudative age-related macular degeneration, left eye, with active choroidal neovascularization: Secondary | ICD-10-CM | POA: Diagnosis not present

## 2017-05-23 DIAGNOSIS — H35022 Exudative retinopathy, left eye: Secondary | ICD-10-CM | POA: Diagnosis not present

## 2017-06-17 DIAGNOSIS — Z08 Encounter for follow-up examination after completed treatment for malignant neoplasm: Secondary | ICD-10-CM | POA: Diagnosis not present

## 2017-06-17 DIAGNOSIS — L281 Prurigo nodularis: Secondary | ICD-10-CM | POA: Diagnosis not present

## 2017-06-17 DIAGNOSIS — Z1283 Encounter for screening for malignant neoplasm of skin: Secondary | ICD-10-CM | POA: Diagnosis not present

## 2017-06-17 DIAGNOSIS — L821 Other seborrheic keratosis: Secondary | ICD-10-CM | POA: Diagnosis not present

## 2017-06-17 DIAGNOSIS — Z85820 Personal history of malignant melanoma of skin: Secondary | ICD-10-CM | POA: Diagnosis not present

## 2017-08-01 DIAGNOSIS — H35722 Serous detachment of retinal pigment epithelium, left eye: Secondary | ICD-10-CM | POA: Diagnosis not present

## 2017-08-01 DIAGNOSIS — H35022 Exudative retinopathy, left eye: Secondary | ICD-10-CM | POA: Diagnosis not present

## 2017-08-01 DIAGNOSIS — H35731 Hemorrhagic detachment of retinal pigment epithelium, right eye: Secondary | ICD-10-CM | POA: Diagnosis not present

## 2017-08-01 DIAGNOSIS — H353221 Exudative age-related macular degeneration, left eye, with active choroidal neovascularization: Secondary | ICD-10-CM | POA: Diagnosis not present

## 2017-09-22 DIAGNOSIS — R7301 Impaired fasting glucose: Secondary | ICD-10-CM | POA: Diagnosis not present

## 2017-09-22 DIAGNOSIS — Z85820 Personal history of malignant melanoma of skin: Secondary | ICD-10-CM | POA: Diagnosis not present

## 2017-09-22 DIAGNOSIS — E7800 Pure hypercholesterolemia, unspecified: Secondary | ICD-10-CM | POA: Diagnosis not present

## 2017-09-22 DIAGNOSIS — K219 Gastro-esophageal reflux disease without esophagitis: Secondary | ICD-10-CM | POA: Diagnosis not present

## 2017-09-22 DIAGNOSIS — Z1389 Encounter for screening for other disorder: Secondary | ICD-10-CM | POA: Diagnosis not present

## 2017-09-22 DIAGNOSIS — Z0000 Encounter for general adult medical examination without abnormal findings: Secondary | ICD-10-CM | POA: Diagnosis not present

## 2017-10-09 DIAGNOSIS — T783XXA Angioneurotic edema, initial encounter: Secondary | ICD-10-CM | POA: Diagnosis not present

## 2017-10-17 DIAGNOSIS — H401111 Primary open-angle glaucoma, right eye, mild stage: Secondary | ICD-10-CM | POA: Diagnosis not present

## 2017-10-17 DIAGNOSIS — H2513 Age-related nuclear cataract, bilateral: Secondary | ICD-10-CM | POA: Diagnosis not present

## 2017-11-10 IMAGING — MR MR SHOULDER*R* W/CM
6 series · 40 of 40 positions shown · IV contrast (agent unspecified)
Comparison: None.

CLINICAL DATA: Right shoulder pain.  Pain for 6 months.

EXAM:
MR ARTHROGRAM OF THE RIGHT SHOULDER
TECHNIQUE: Multiplanar, multisequence MR imaging of the right shoulder was
performed following the administration of intra-articular contrast.
CONTRAST:  See Injection Documentation.

[Series 4: T2 fat-sat · coronal · 4.0mm · 0.55mm/px · 8 of 16 slices shown (1 of 2)]
[im 1/16]
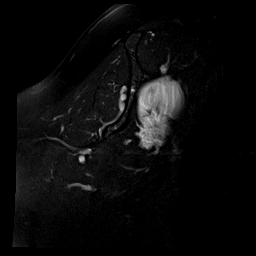
[im 3/16]
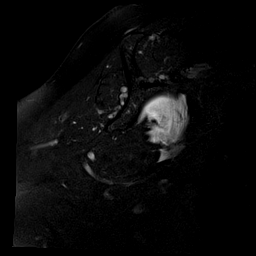
[im 5/16]
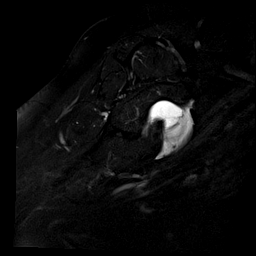
[im 7/16]
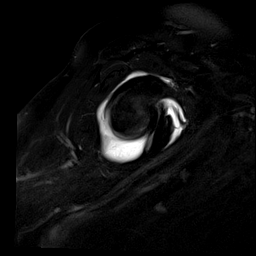
[im 9/16]
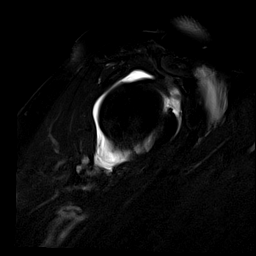
[im 11/16]
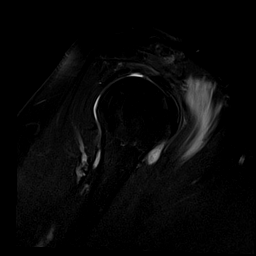
[im 13/16]
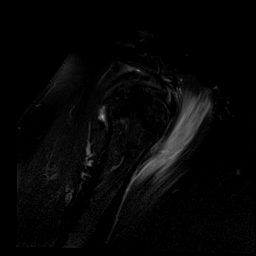
[im 16/16]
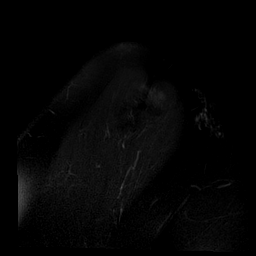

[Series 5: T1 fat-sat · sagittal · 4.0mm · 0.44mm/px · 6 of 14 slices shown (1 of 4)]
[im 1/14]
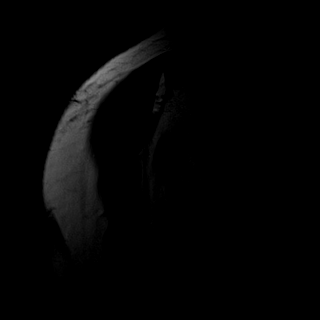
[im 3/14]
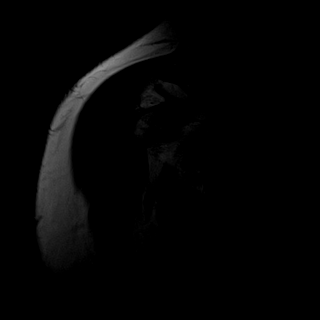
[im 6/14]
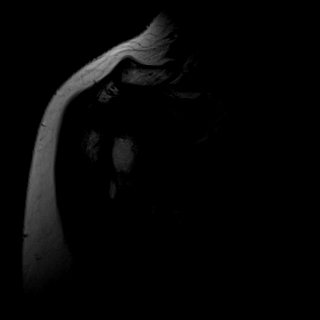
[im 8/14]
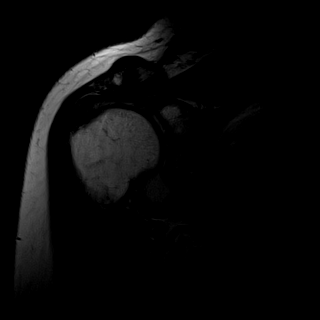
[im 11/14]
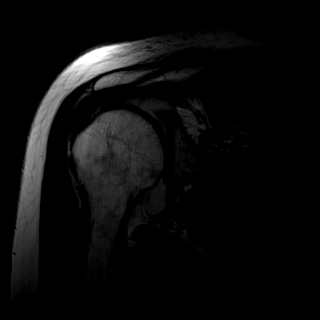
[im 14/14]
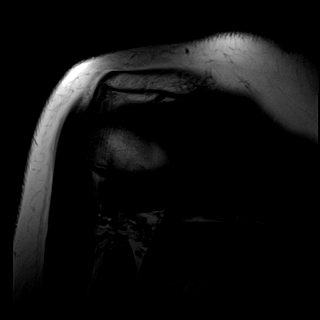

[Series 6: T1 fat-sat · sagittal · 4.0mm · 0.44mm/px · 6 of 14 slices shown (2 of 4)]
[im 1/14]
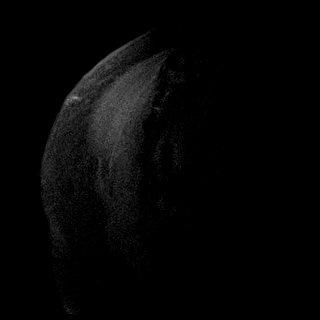
[im 3/14]
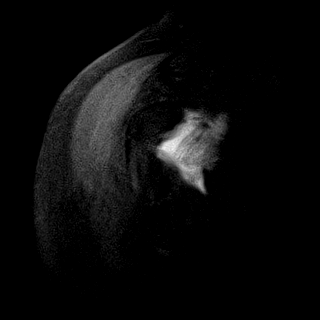
[im 6/14]
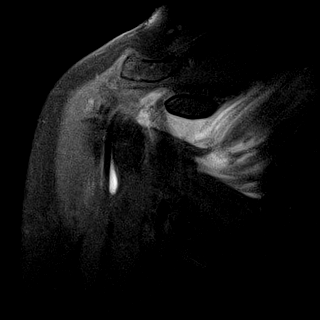
[im 8/14]
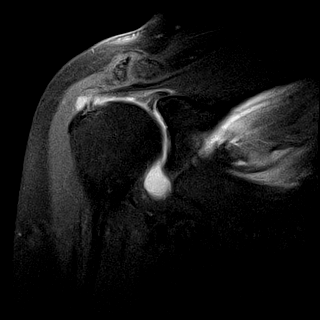
[im 11/14]
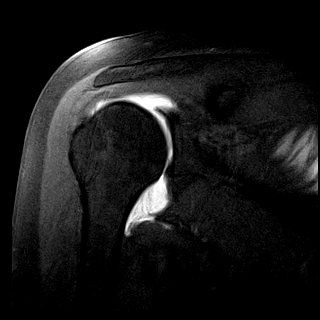
[im 14/14]
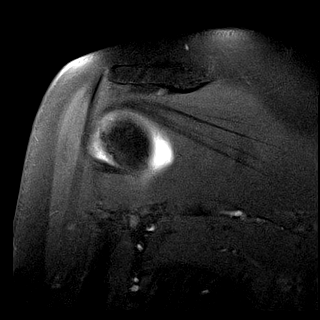

[Series 7: T2 fat-sat · sagittal · 4.0mm · 0.55mm/px · 6 of 14 slices shown (2 of 2)]
[im 1/14]
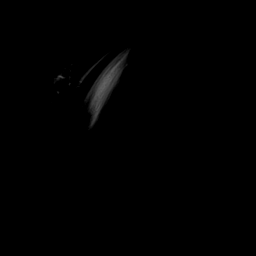
[im 3/14]
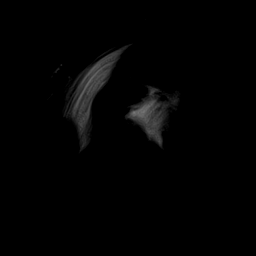
[im 6/14]
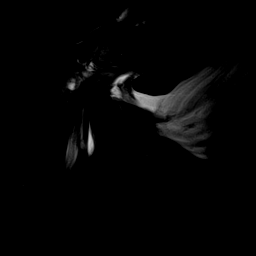
[im 8/14]
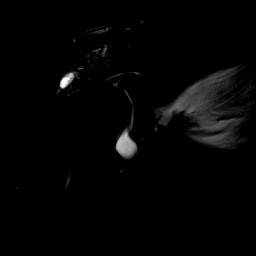
[im 11/14]
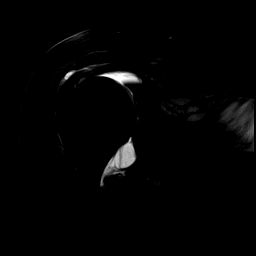
[im 14/14]
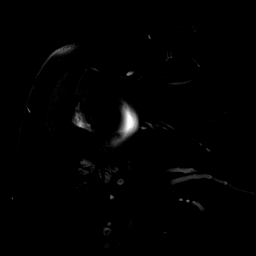

[Series 8: T1 fat-sat · axial · 4.0mm · 0.27mm/px · z∈[-17,+51]mm · 7 of 16 slices shown (3 of 4)]
[im 1/16]
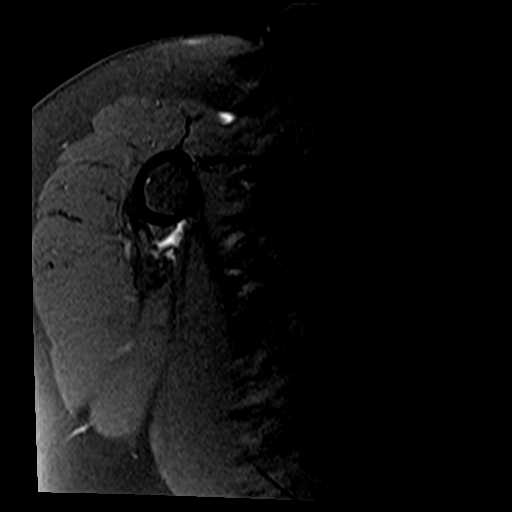
[im 3/16]
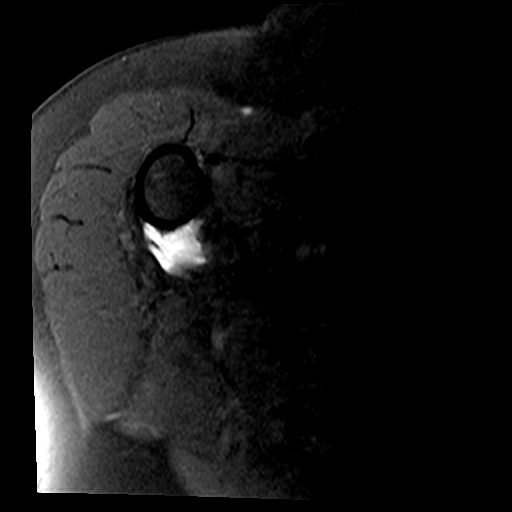
[im 6/16]
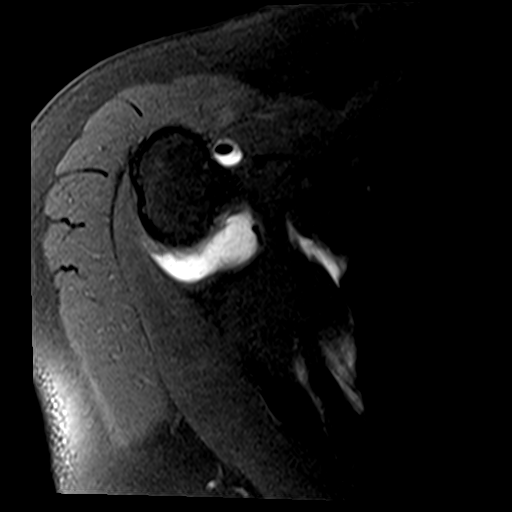
[im 8/16]
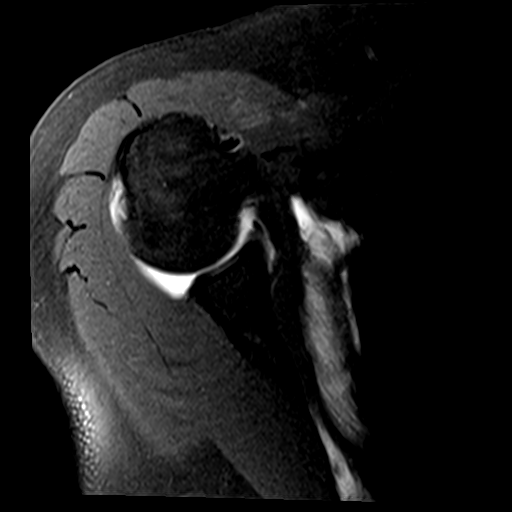
[im 11/16]
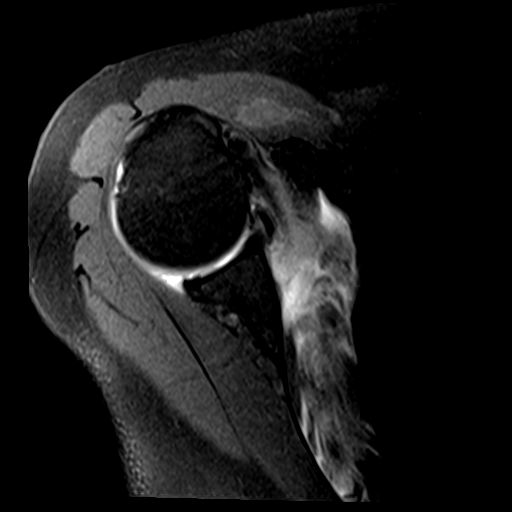
[im 13/16]
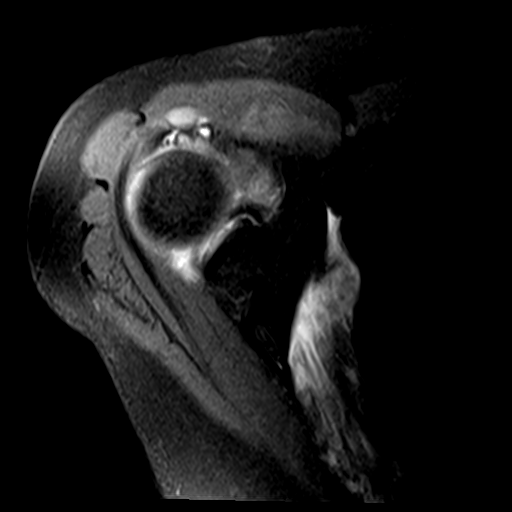
[im 16/16]
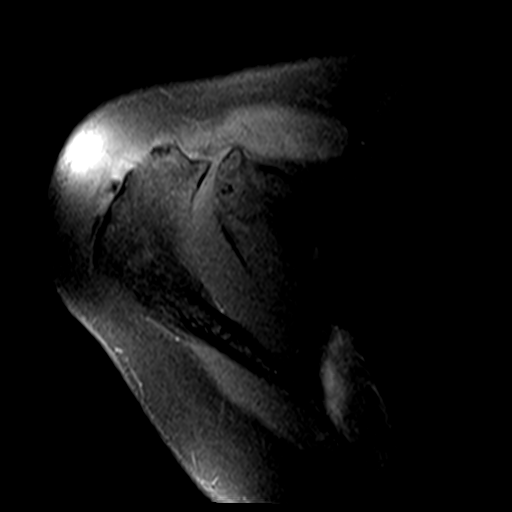

[Series 11: T1 fat-sat · sagittal · 4.0mm · 0.59mm/px · 7 of 16 slices shown (4 of 4)]
[im 1/16]
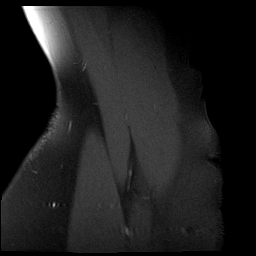
[im 3/16]
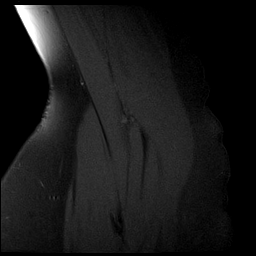
[im 6/16]
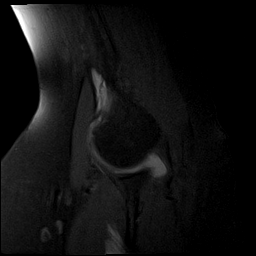
[im 8/16]
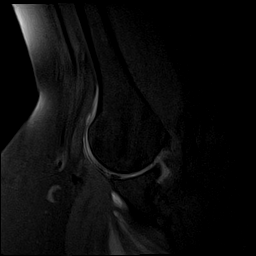
[im 11/16]
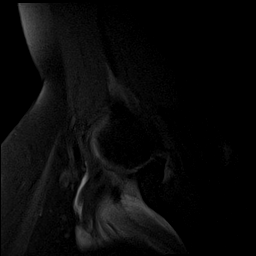
[im 13/16]
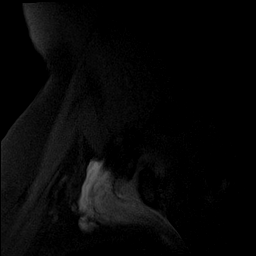
[im 16/16]
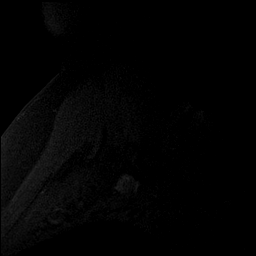

[40 of 40 positions shown; findings below may reference images not displayed]

FINDINGS: Rotator cuff: Severe tendinosis of the supraspinatus tendon with a
small high-grade partial-thickness articular surface tear of the
anterior fibers with possible tiny full-thickness component. Mild
tendinosis of the infraspinatus tendon. Teres minor tendon is
intact. Subscapularis tendon is intact.

Muscles: No atrophy or fatty replacement of nor abnormal signal
within, the muscles of the rotator cuff.

Biceps long head: Intact.

Acromioclavicular Joint: Moderate degenerative changes of the
acromioclavicular joint. Type II acromion. 5 x 10 x 27 mm
multiloculated cystic structure containing intraarticular contrast
along the bursal surface of the supraspinatus tendon likely
reflecting a small ganglion cyst.

Glenohumeral Joint: Intraarticular contrast distending the joint
capsule. Partial-thickness cartilage loss the glenohumeral joint.
Normal glenohumeral ligaments.

Labrum: Grossly intact, but evaluation is limited by lack of
intraarticular fluid.

Bones: No focal marrow signal abnormality. No fracture or
dislocation.
IMPRESSION: 1. Severe tendinosis of the supraspinatus tendon with a small
high-grade partial-thickness articular surface tear of the anterior
fibers with possible tiny full-thickness component.
2. Mild tendinosis of the infraspinatus tendon.
3. 5 x 10 x 27 mm multiloculated cystic structure along the bursal
surface of the supraspinatus tendon likely reflecting a small
ganglion cyst which appears communicate with the joint space.
4. Partial-thickness cartilage loss the glenohumeral joint.

## 2017-11-23 DIAGNOSIS — N390 Urinary tract infection, site not specified: Secondary | ICD-10-CM | POA: Diagnosis not present

## 2017-11-23 DIAGNOSIS — R319 Hematuria, unspecified: Secondary | ICD-10-CM | POA: Diagnosis not present

## 2017-12-04 DIAGNOSIS — H35731 Hemorrhagic detachment of retinal pigment epithelium, right eye: Secondary | ICD-10-CM | POA: Diagnosis not present

## 2017-12-04 DIAGNOSIS — H35022 Exudative retinopathy, left eye: Secondary | ICD-10-CM | POA: Diagnosis not present

## 2017-12-04 DIAGNOSIS — H35722 Serous detachment of retinal pigment epithelium, left eye: Secondary | ICD-10-CM | POA: Diagnosis not present

## 2017-12-04 DIAGNOSIS — H353221 Exudative age-related macular degeneration, left eye, with active choroidal neovascularization: Secondary | ICD-10-CM | POA: Diagnosis not present

## 2017-12-11 DIAGNOSIS — N390 Urinary tract infection, site not specified: Secondary | ICD-10-CM | POA: Diagnosis not present

## 2018-01-06 DIAGNOSIS — Z23 Encounter for immunization: Secondary | ICD-10-CM | POA: Diagnosis not present

## 2018-01-06 DIAGNOSIS — C4372 Malignant melanoma of left lower limb, including hip: Secondary | ICD-10-CM | POA: Diagnosis not present

## 2018-03-01 DIAGNOSIS — H81399 Other peripheral vertigo, unspecified ear: Secondary | ICD-10-CM | POA: Diagnosis not present

## 2018-04-20 DIAGNOSIS — H401111 Primary open-angle glaucoma, right eye, mild stage: Secondary | ICD-10-CM | POA: Diagnosis not present

## 2018-04-22 HISTORY — PX: CATARACT EXTRACTION: SUR2

## 2018-05-21 ENCOUNTER — Other Ambulatory Visit: Payer: Self-pay | Admitting: Internal Medicine

## 2018-05-21 DIAGNOSIS — Z1231 Encounter for screening mammogram for malignant neoplasm of breast: Secondary | ICD-10-CM

## 2018-06-11 DIAGNOSIS — H353133 Nonexudative age-related macular degeneration, bilateral, advanced atrophic without subfoveal involvement: Secondary | ICD-10-CM | POA: Diagnosis not present

## 2018-06-11 DIAGNOSIS — H2513 Age-related nuclear cataract, bilateral: Secondary | ICD-10-CM | POA: Diagnosis not present

## 2018-06-11 DIAGNOSIS — H353222 Exudative age-related macular degeneration, left eye, with inactive choroidal neovascularization: Secondary | ICD-10-CM | POA: Diagnosis not present

## 2018-06-11 DIAGNOSIS — H353112 Nonexudative age-related macular degeneration, right eye, intermediate dry stage: Secondary | ICD-10-CM | POA: Diagnosis not present

## 2018-06-18 ENCOUNTER — Ambulatory Visit
Admission: RE | Admit: 2018-06-18 | Discharge: 2018-06-18 | Disposition: A | Discharge status: Home / Self Care | Payer: Medicare Other | Source: Ambulatory Visit | Attending: Internal Medicine | Admitting: Internal Medicine

## 2018-06-18 DIAGNOSIS — Z1231 Encounter for screening mammogram for malignant neoplasm of breast: Secondary | ICD-10-CM

## 2018-06-22 DIAGNOSIS — Z08 Encounter for follow-up examination after completed treatment for malignant neoplasm: Secondary | ICD-10-CM | POA: Diagnosis not present

## 2018-06-22 DIAGNOSIS — Z1283 Encounter for screening for malignant neoplasm of skin: Secondary | ICD-10-CM | POA: Diagnosis not present

## 2018-06-22 DIAGNOSIS — Z85820 Personal history of malignant melanoma of skin: Secondary | ICD-10-CM | POA: Diagnosis not present

## 2018-06-22 DIAGNOSIS — L820 Inflamed seborrheic keratosis: Secondary | ICD-10-CM | POA: Diagnosis not present

## 2018-09-28 DIAGNOSIS — E7800 Pure hypercholesterolemia, unspecified: Secondary | ICD-10-CM | POA: Diagnosis not present

## 2018-09-28 DIAGNOSIS — R7301 Impaired fasting glucose: Secondary | ICD-10-CM | POA: Diagnosis not present

## 2018-09-28 DIAGNOSIS — Z1389 Encounter for screening for other disorder: Secondary | ICD-10-CM | POA: Diagnosis not present

## 2018-09-28 DIAGNOSIS — Z85820 Personal history of malignant melanoma of skin: Secondary | ICD-10-CM | POA: Diagnosis not present

## 2018-09-28 DIAGNOSIS — Z0000 Encounter for general adult medical examination without abnormal findings: Secondary | ICD-10-CM | POA: Diagnosis not present

## 2018-10-26 DIAGNOSIS — H2513 Age-related nuclear cataract, bilateral: Secondary | ICD-10-CM | POA: Diagnosis not present

## 2018-10-26 DIAGNOSIS — H35352 Cystoid macular degeneration, left eye: Secondary | ICD-10-CM | POA: Diagnosis not present

## 2018-10-26 DIAGNOSIS — H401421 Capsular glaucoma with pseudoexfoliation of lens, left eye, mild stage: Secondary | ICD-10-CM | POA: Diagnosis not present

## 2018-11-04 DIAGNOSIS — H353221 Exudative age-related macular degeneration, left eye, with active choroidal neovascularization: Secondary | ICD-10-CM | POA: Diagnosis not present

## 2018-11-04 DIAGNOSIS — H2513 Age-related nuclear cataract, bilateral: Secondary | ICD-10-CM | POA: Diagnosis not present

## 2018-11-04 DIAGNOSIS — H3562 Retinal hemorrhage, left eye: Secondary | ICD-10-CM | POA: Diagnosis not present

## 2018-11-04 DIAGNOSIS — H353112 Nonexudative age-related macular degeneration, right eye, intermediate dry stage: Secondary | ICD-10-CM | POA: Diagnosis not present

## 2018-11-04 DIAGNOSIS — H353133 Nonexudative age-related macular degeneration, bilateral, advanced atrophic without subfoveal involvement: Secondary | ICD-10-CM | POA: Diagnosis not present

## 2018-12-07 DIAGNOSIS — H353124 Nonexudative age-related macular degeneration, left eye, advanced atrophic with subfoveal involvement: Secondary | ICD-10-CM | POA: Diagnosis not present

## 2018-12-07 DIAGNOSIS — H3562 Retinal hemorrhage, left eye: Secondary | ICD-10-CM | POA: Diagnosis not present

## 2018-12-07 DIAGNOSIS — H353221 Exudative age-related macular degeneration, left eye, with active choroidal neovascularization: Secondary | ICD-10-CM | POA: Diagnosis not present

## 2018-12-15 DIAGNOSIS — Z23 Encounter for immunization: Secondary | ICD-10-CM | POA: Diagnosis not present

## 2019-01-12 DIAGNOSIS — C4372 Malignant melanoma of left lower limb, including hip: Secondary | ICD-10-CM | POA: Diagnosis not present

## 2019-01-28 DIAGNOSIS — H4421 Degenerative myopia, right eye: Secondary | ICD-10-CM | POA: Diagnosis not present

## 2019-01-28 DIAGNOSIS — H353221 Exudative age-related macular degeneration, left eye, with active choroidal neovascularization: Secondary | ICD-10-CM | POA: Diagnosis not present

## 2019-01-28 DIAGNOSIS — H353133 Nonexudative age-related macular degeneration, bilateral, advanced atrophic without subfoveal involvement: Secondary | ICD-10-CM | POA: Diagnosis not present

## 2019-01-28 DIAGNOSIS — H3562 Retinal hemorrhage, left eye: Secondary | ICD-10-CM | POA: Diagnosis not present

## 2019-03-22 DIAGNOSIS — H353222 Exudative age-related macular degeneration, left eye, with inactive choroidal neovascularization: Secondary | ICD-10-CM | POA: Diagnosis not present

## 2019-03-22 DIAGNOSIS — H2513 Age-related nuclear cataract, bilateral: Secondary | ICD-10-CM | POA: Diagnosis not present

## 2019-03-22 DIAGNOSIS — H401421 Capsular glaucoma with pseudoexfoliation of lens, left eye, mild stage: Secondary | ICD-10-CM | POA: Diagnosis not present

## 2019-04-01 DIAGNOSIS — H2513 Age-related nuclear cataract, bilateral: Secondary | ICD-10-CM | POA: Diagnosis not present

## 2019-04-01 DIAGNOSIS — H353133 Nonexudative age-related macular degeneration, bilateral, advanced atrophic without subfoveal involvement: Secondary | ICD-10-CM | POA: Diagnosis not present

## 2019-04-01 DIAGNOSIS — H353221 Exudative age-related macular degeneration, left eye, with active choroidal neovascularization: Secondary | ICD-10-CM | POA: Diagnosis not present

## 2019-04-01 DIAGNOSIS — H3562 Retinal hemorrhage, left eye: Secondary | ICD-10-CM | POA: Diagnosis not present

## 2019-04-23 HISTORY — PX: CATARACT EXTRACTION: SUR2

## 2019-05-11 DIAGNOSIS — H2181 Floppy iris syndrome: Secondary | ICD-10-CM | POA: Diagnosis not present

## 2019-05-11 DIAGNOSIS — H2511 Age-related nuclear cataract, right eye: Secondary | ICD-10-CM | POA: Diagnosis not present

## 2019-05-11 DIAGNOSIS — H25811 Combined forms of age-related cataract, right eye: Secondary | ICD-10-CM | POA: Diagnosis not present

## 2019-05-14 ENCOUNTER — Other Ambulatory Visit: Payer: Self-pay | Admitting: Internal Medicine

## 2019-05-14 DIAGNOSIS — Z1231 Encounter for screening mammogram for malignant neoplasm of breast: Secondary | ICD-10-CM

## 2019-06-03 DIAGNOSIS — H353133 Nonexudative age-related macular degeneration, bilateral, advanced atrophic without subfoveal involvement: Secondary | ICD-10-CM | POA: Diagnosis not present

## 2019-06-03 DIAGNOSIS — H43812 Vitreous degeneration, left eye: Secondary | ICD-10-CM | POA: Diagnosis not present

## 2019-06-03 DIAGNOSIS — H353221 Exudative age-related macular degeneration, left eye, with active choroidal neovascularization: Secondary | ICD-10-CM | POA: Diagnosis not present

## 2019-06-03 DIAGNOSIS — H3562 Retinal hemorrhage, left eye: Secondary | ICD-10-CM | POA: Diagnosis not present

## 2019-06-14 DIAGNOSIS — Z1283 Encounter for screening for malignant neoplasm of skin: Secondary | ICD-10-CM | POA: Diagnosis not present

## 2019-06-14 DIAGNOSIS — Z08 Encounter for follow-up examination after completed treatment for malignant neoplasm: Secondary | ICD-10-CM | POA: Diagnosis not present

## 2019-06-14 DIAGNOSIS — Z85820 Personal history of malignant melanoma of skin: Secondary | ICD-10-CM | POA: Diagnosis not present

## 2019-06-14 DIAGNOSIS — L820 Inflamed seborrheic keratosis: Secondary | ICD-10-CM | POA: Diagnosis not present

## 2019-06-24 ENCOUNTER — Other Ambulatory Visit: Payer: Self-pay

## 2019-06-24 ENCOUNTER — Ambulatory Visit
Admission: RE | Admit: 2019-06-24 | Discharge: 2019-06-24 | Disposition: A | Discharge status: Home / Self Care | Payer: Medicare Other | Source: Ambulatory Visit | Attending: Internal Medicine | Admitting: Internal Medicine

## 2019-06-24 DIAGNOSIS — Z1231 Encounter for screening mammogram for malignant neoplasm of breast: Secondary | ICD-10-CM

## 2019-08-11 ENCOUNTER — Encounter (INDEPENDENT_AMBULATORY_CARE_PROVIDER_SITE_OTHER): Payer: Self-pay | Admitting: Ophthalmology

## 2019-08-11 ENCOUNTER — Other Ambulatory Visit: Payer: Self-pay

## 2019-08-11 ENCOUNTER — Ambulatory Visit (INDEPENDENT_AMBULATORY_CARE_PROVIDER_SITE_OTHER): Payer: Medicare Other | Admitting: Ophthalmology

## 2019-08-11 DIAGNOSIS — H353133 Nonexudative age-related macular degeneration, bilateral, advanced atrophic without subfoveal involvement: Secondary | ICD-10-CM | POA: Diagnosis not present

## 2019-08-11 DIAGNOSIS — H353124 Nonexudative age-related macular degeneration, left eye, advanced atrophic with subfoveal involvement: Secondary | ICD-10-CM | POA: Insufficient documentation

## 2019-08-11 DIAGNOSIS — H353221 Exudative age-related macular degeneration, left eye, with active choroidal neovascularization: Secondary | ICD-10-CM | POA: Insufficient documentation

## 2019-08-11 DIAGNOSIS — H3562 Retinal hemorrhage, left eye: Secondary | ICD-10-CM | POA: Insufficient documentation

## 2019-08-11 DIAGNOSIS — H353222 Exudative age-related macular degeneration, left eye, with inactive choroidal neovascularization: Secondary | ICD-10-CM | POA: Insufficient documentation

## 2019-08-11 MED ORDER — BEVACIZUMAB CHEMO INJECTION 1.25MG/0.05ML SYRINGE FOR KALEIDOSCOPE
1.2500 mg | INTRAVITREAL | Status: AC | PRN
  Administered 2019-08-11: 09:00:00 1.25 mg via INTRAVITREAL

## 2019-08-11 NOTE — Patient Instructions (Signed)
Age-Related Macular Degeneration  Age-related macular degeneration (AMD) is an eye disease related to aging. The disease causes a loss of central vision. Central vision allows a person to see objects clearly and do daily tasks like reading and driving. There are two main types of AMD:  Dry AMD. People with this type generally lose their vision slowly. This is the most common type of AMD. Some people with dry AMD notice very little change in their vision as they age.  Wet AMD. People with this type can lose their vision quickly. What are the causes? This condition is caused by damage to the part of the eye that provides you with central vision (macula).  Dry AMD happens when deposits in the macula cause light-sensitive cells to slowly break down.  Wet AMD happens when abnormal blood vessels grow under the macula and leak blood and fluid. What increases the risk? You are more likely to develop this condition if you:  Are 50 years old or older, and especially 75 years old or older.  Smoke.  Are obese.  Have a family history of AMD.  Have high cholesterol, high blood pressure, or heart disease.  Have been exposed to high levels of ultraviolet (UV) light and blue light.  Are white (Caucasian).  Are female. What are the signs or symptoms? Common symptoms of this condition include:  Blurred vision, especially when reading print material. The blurred vision often improves in brighter light.  A blurred or blind spot in the center of your field of vision that is small but growing larger.  Bright colors seeming less bright than they used to be.  Decreased ability to recognize and see faces.  One eye seeing worse than the other.  Decreased ability to adapt to dimly lit rooms.  Straight lines appearing crooked or wavy. How is this diagnosed? This condition is diagnosed based on your symptoms and an eye exam. During the eye exam:  Eye drops will be placed into your eyes to  enlarge (dilate) your pupils. This will allow your health care provider to see the back of your eye.  You may be asked to look at an image that looks like a checkerboard (Amsler grid). Early changes in your central vision may cause the grid to appear distorted. After the exam, you may be given one or both of these tests:  Fluorescein angiogram. This test determines whether you have dry or wet AMD.  Optical coherence tomography (OCT) test to evaluate deep layers of the retina. How is this treated? There is no cure for this condition, but treatment can help to slow down progression of the disease. This condition may be treated with:  Supplements, including vitamin C, vitamin E, beta carotene, and zinc.  Laser surgery to destroy new blood vessels or leaking blood vessels in your eye.  Injections of medicines into your eye to slow down the formation of abnormal blood vessels that may leak. These injections may need to be repeated on a routine basis. Follow these instructions at home:  Take over-the-counter and prescription medicines only as told by your health care provider.  Take vitamins and supplements as told by your health care provider.  Ask your health care provider for an Amsler grid. Use it every day to check each eye for vision changes.  Get an eye exam as often as told by your health care provider. Make sure to get an eye exam at least once every year.  Keep all follow-up visits as told by   your health care provider. This is important. Contact a health care provider if:  You notice any new changes in your vision. Get help right away if:  You suddenly lose vision or develop pain in the eye. Summary  Age-related macular degeneration (AMD) is an eye disease related to aging. There are two types of this condition: dry AMD and wet AMD.  This condition is caused by damage to the part of the eye that provides you with central vision (macula).  Once diagnosed with AMD, make sure  to get an eye exam every year, take supplements and vitamins as directed, use an Amsler grid at home, and follow up with your health care provider. This information is not intended to replace advice given to you by your health care provider. Make sure you discuss any questions you have with your health care provider. Document Revised: 10/15/2017 Document Reviewed: 10/15/2017 Elsevier Patient Education  [employer].   The nature of wet macular degeneration was discussed with the patient.  Forms of therapy reviewed include the use of Anti-VEGF medications injected painlessly into the eye, as well as other possible treatment modalities, including thermal laser therapy. Fellow eye involvement and risks were discussed with the patient. Upon the finding of wet age related macular degeneration, treatment will be offered. The treatment regimen is on a treat as needed basis with the intent to treat if necessary and extend interval of exams when possible. On average 1 out of 6 patients do not need lifetime therapy. However, the risk of recurrent disease is high for a lifetime.  Initially monthly, then periodic, examinations and evaluations will determine whether the next treatment is required on the day of the examination.

## 2019-08-11 NOTE — Progress Notes (Signed)
08/11/2019     CHIEF COMPLAINT Patient presents for Retina Follow Up   HISTORY OF PRESENT ILLNESS: Dana Randall is a 69 y.o. female who presents to the clinic today for:   HPI    Retina Follow Up    Patient presents with  Wet AMD.  In left eye.  Duration of 10 weeks.  Since onset it is stable.          Comments    10 week follow up - OCT OU, Possible Avastin OS Patient states that she has had a lot of light sensitivity since last visit. Patient also states she has FOL in OD at night.       Last edited by Gerda Diss on 08/11/2019  8:10 AM. (History)      Referring physician: Lavone Orn, MD [address] [place],  [place] [id number]  HISTORICAL INFORMATION:   Selected notes from the [facility]: No current outpatient medications on file. (Ophthalmic Drugs)   No current facility-administered medications for this visit. (Ophthalmic Drugs)   Current Outpatient Medications (Other)  Medication Sig  . aspirin 81 MG chewable tablet Chew 81 mg by mouth daily.  Marland Kitchen atorvastatin (LIPITOR) 20 MG tablet Take 20 mg by mouth daily.  . Multiple Vitamins-Minerals (ICAPS AREDS 2 PO) Take 1 mg by mouth daily.  . Omega-3 Fatty Acids (FISH OIL) 1000 MG CAPS Take 1,000 mg by mouth daily.  Marland Kitchen omeprazole (PRILOSEC) 20 MG capsule Take 20 mg by mouth daily.   No current facility-administered medications for this visit. (Other)      REVIEW OF SYSTEMS:    ALLERGIES Allergies  Allergen Reactions  . Codeine Rash    PAST MEDICAL HISTORY History reviewed. No pertinent past medical history. Past Surgical History:  Procedure Laterality Date  . CATARACT EXTRACTION Right 2021   Dr. Prudencio Burly  . CATARACT EXTRACTION Left 2020   Dr. Prudencio Burly    FAMILY HISTORY History reviewed. No pertinent family history.  SOCIAL HISTORY Social History   Tobacco Use  . Smoking status: Never Smoker  . Smokeless tobacco: Never Used  Substance Use  Topics  . Alcohol use: No  . Drug use: No         OPHTHALMIC EXAM:  Base Eye Exam    Visual Acuity (Snellen - Linear)      Right Left   Dist [place] 20/20-1 20/400   Dist ph [place]  20/200-1       Tonometry (Tonopen, 8:19 AM)      Right Left   Pressure 11 12       Pupils      Pupils Dark Light Shape React APD   Right PERRL 3 3 Round Minimal None   Left PERRL 3 3 Round Minimal None       Visual Fields (Counting fingers)      Left Right    Full        Extraocular Movement      Right Left    Full Full       Neuro/Psych    Oriented x3: Yes   Mood/Affect: Normal       Dilation    Both eyes: 1.0% Mydriacyl, 2.5% Phenylephrine @ 8:19 AM        Slit Lamp and Fundus Exam    External Exam      Right Left   External Normal Normal  Slit Lamp Exam      Right Left   Lids/Lashes Normal Normal   Conjunctiva/Sclera White and quiet White and quiet   Cornea Clear Clear   Anterior Chamber Deep and quiet Deep and quiet   Iris Round and reactive Round and reactive   Lens Posterior chamber intraocular lens Posterior chamber intraocular lens   Anterior Vitreous Normal Normal       Fundus Exam      Right Left   Posterior Vitreous Normal Normal   Disc Peripapillary atrophy Peripapillary atrophy   C/D Ratio 0.65 0.7   Macula Atrophy, Early age related macular degeneration, Retinal pigment epithelial atrophy, Mottling, Retinal pigment epithelial mottling, no macular thickening, no exudates, no hemorrhage Atrophy, Pigmented atrophy, Retinal pigment epithelial atrophy,, with region of retinal thickening temporal to the foveal avascular zone.  This area represents extra foveal disciform scar in the clinicians macula   Vessels Normal Normal   Periphery Normal Normal          IMAGING AND PROCEDURES  Imaging and Procedures for @TODAY @  OCT, Retina - OU - Both Eyes       Right Eye Quality was good. Scan locations included subfoveal. Central Foveal Thickness: 299.  Progression has been stable. Findings include myopic contour, normal foveal contour, no IRF, no SRF, retinal drusen .   Left Eye Central Foveal Thickness: 358. Progression has improved. Findings include myopic contour, outer retinal atrophy, central retinal atrophy, inner retinal atrophy, cystoid macular edema.   Notes OD, no active disease  OS, retinal thickening and cystoid retinal changes temporal to the foveal region, stable and overall improved       Intravitreal Injection, Pharmacologic Agent - OS - Left Eye       Time Out 08/11/2019. 8:52 AM. Confirmed correct patient, procedure, site, and patient consented.   Anesthesia Topical anesthesia was used. Anesthetic medications included Akten 3.5%.   Procedure Preparation included Ofloxacin , 10% betadine to eyelids, 5% betadine to ocular surface. A 30 gauge needle was used.   Injection:  1.25 mg Bevacizumab (AVASTIN) SOLN   NDC: [id number], Lot: [id number]   Route: Intravitreal, Site: Left Eye, Waste: 0 mg  Post-op Post injection exam found visual acuity of at least counting fingers. The patient tolerated the procedure well. There were no complications. The patient received written and verbal post procedure care education. Post injection medications were not given.                 ASSESSMENT/PLAN:  No problem-specific Assessment & Plan notes found for this encounter.      ICD-10-CM   1. Exudative age-related macular degeneration of left eye with active choroidal neovascularization (HCC)  H35.3221 OCT, Retina - OU - Both Eyes    Intravitreal Injection, Pharmacologic Agent - OS - Left Eye    Bevacizumab (AVASTIN) SOLN 1.25 mg  2. 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 Retinal hemorrhage of left eye  H35.62 OCT, Retina - OU - Both Eyes    1.  Currently on 10-week follow-up of the left eye, treating extra macular  active choroidal neovascular membrane to prevent scotoma  growth  2.  Return visit in 10 weeks for examination left eye  3.  Ophthalmic Meds Ordered this visit:  Meds ordered this encounter  Medications  . Bevacizumab (AVASTIN) SOLN 1.25 mg       Return in about 8 weeks (around 10/06/2019)  for AVASTIN OCT, OS.  Patient Instructions  Age-Related Macular Degeneration  Age-related macular degeneration (AMD) is an eye disease related to aging. The disease causes a loss of central vision. Central vision allows a person to see objects clearly and do daily tasks like reading and driving. There are two main types of AMD:  Dry AMD. People with this type generally lose their vision slowly. This is the most common type of AMD. Some people with dry AMD notice very little change in their vision as they age.  Wet AMD. People with this type can lose their vision quickly. What are the causes? This condition is caused by damage to the part of the eye that provides you with central vision (macula).  Dry AMD happens when deposits in the macula cause light-sensitive cells to slowly break down.  Wet AMD happens when abnormal blood vessels grow under the macula and leak blood and fluid. What increases the risk? You are more likely to develop this condition if you:  Are 31 years old or older, and especially 66 years old or older.  Smoke.  Are obese.  Have a family history of AMD.  Have high cholesterol, high blood pressure, or heart disease.  Have been exposed to high levels of ultraviolet (UV) light and blue light.  Are white (Caucasian).  Are female. What are the signs or symptoms? Common symptoms of this condition include:  Blurred vision, especially when reading print material. The blurred vision often improves in brighter light.  A blurred or blind spot in the center of your field of vision that is small but growing larger.  Bright colors seeming less bright than they used to  be.  Decreased ability to recognize and see faces.  One eye seeing worse than the other.  Decreased ability to adapt to dimly lit rooms.  Straight lines appearing crooked or wavy. How is this diagnosed? This condition is diagnosed based on your symptoms and an eye exam. During the eye exam:  Eye drops will be placed into your eyes to enlarge (dilate) your pupils. This will allow your health care provider to see the back of your eye.  You may be asked to look at an image that looks like a checkerboard (Amsler grid). Early changes in your central vision may cause the grid to appear distorted. After the exam, you may be given one or both of these tests:  Fluorescein angiogram. This test determines whether you have dry or wet AMD.  Optical coherence tomography (OCT) test to evaluate deep layers of the retina. How is this treated? There is no cure for this condition, but treatment can help to slow down progression of the disease. This condition may be treated with:  Supplements, including vitamin C, vitamin E, beta carotene, and zinc.  Laser surgery to destroy new blood vessels or leaking blood vessels in your eye.  Injections of medicines into your eye to slow down the formation of abnormal blood vessels that may leak. These injections may need to be repeated on a routine basis. Follow these instructions at home:  Take over-the-counter and prescription medicines only as told by your health care provider.  Take vitamins and supplements as told by your health care provider.  Ask your health care provider for an Amsler grid. Use it every day to check each eye for vision changes.  Get an eye exam as often as told by your health care provider. Make sure to get an eye exam at least once every year.  Keep all follow-up visits as told by your health care provider. This is important. Contact a health care provider if:  You notice any new changes in your vision. Get help right away  if:  You suddenly lose vision or develop pain in the eye. Summary  Age-related macular degeneration (AMD) is an eye disease related to aging. There are two types of this condition: dry AMD and wet AMD.  This condition is caused by damage to the part of the eye that provides you with central vision (macula).  Once diagnosed with AMD, make sure to get an eye exam every year, take supplements and vitamins as directed, use an Amsler grid at home, and follow up with your health care provider. This information is not intended to replace advice given to you by your health care provider. Make sure you discuss any questions you have with your health care provider. Document Revised: 10/15/2017 Document Reviewed: 10/15/2017 Elsevier Patient Education  [place].   The nature of wet macular degeneration was discussed with the patient.  Forms of therapy reviewed include the use of Anti-VEGF medications injected painlessly into the eye, as well as other possible treatment modalities, including thermal laser therapy. Fellow eye involvement and risks were discussed with the patient. Upon the finding of wet age related macular degeneration, treatment will be offered. The treatment regimen is on a treat as needed basis with the intent to treat if necessary and extend interval of exams when possible. On average 1 out of 6 patients do not need lifetime therapy. However, the risk of recurrent disease is high for a lifetime.  Initially monthly, then periodic, examinations and evaluations will determine whether the next treatment is required on the day of the examination.    Explained the diagnoses, plan, and follow up with the patient and they expressed understanding.  Patient expressed understanding of the importance of proper follow up care.   Clent Demark Sherry Rogus M.D. Diseases & Surgery of the Retina and Vitreous Retina & Diabetic [place] @TODAY @     Abbreviations: M myopia (nearsighted); A  astigmatism; H hyperopia (farsighted); P presbyopia; Mrx spectacle prescription;  CTL contact lenses; OD right eye; OS left eye; OU both eyes  XT exotropia; ET esotropia; PEK punctate epithelial keratitis; PEE punctate epithelial erosions; DES dry eye syndrome; MGD meibomian gland dysfunction; ATs artificial tears; PFAT's preservative free artificial tears; [place] nuclear sclerotic cataract; PSC posterior subcapsular cataract; ERM epi-retinal membrane; PVD posterior vitreous detachment; RD retinal detachment; DM diabetes mellitus; DR diabetic retinopathy; NPDR non-proliferative diabetic retinopathy; PDR proliferative diabetic retinopathy; CSME clinically significant macular edema; DME diabetic macular edema; dbh dot blot hemorrhages; CWS cotton wool spot; POAG primary open angle glaucoma; C/D cup-to-disc ratio; HVF humphrey visual field; GVF goldmann visual field; OCT optical coherence tomography; IOP intraocular pressure; BRVO Branch retinal vein occlusion; CRVO central retinal vein occlusion; CRAO central retinal artery occlusion; BRAO branch retinal artery occlusion; RT retinal tear; SB scleral buckle; PPV pars plana vitrectomy; VH Vitreous hemorrhage; PRP panretinal laser photocoagulation; IVK intravitreal kenalog; VMT vitreomacular traction; MH Macular hole;  NVD neovascularization of the disc; NVE neovascularization elsewhere; AREDS age related eye disease study; ARMD age related macular degeneration; POAG primary open angle glaucoma; EBMD epithelial/anterior basement membrane dystrophy; ACIOL anterior chamber intraocular lens; IOL intraocular lens; PCIOL posterior chamber intraocular lens; Phaco/IOL phacoemulsification with intraocular lens placement; [place] photorefractive keratectomy; LASIK laser assisted in situ keratomileusis; HTN hypertension; DM diabetes mellitus; COPD chronic obstructive pulmonary disease

## 2019-09-30 DIAGNOSIS — R7301 Impaired fasting glucose: Secondary | ICD-10-CM | POA: Diagnosis not present

## 2019-09-30 DIAGNOSIS — E7800 Pure hypercholesterolemia, unspecified: Secondary | ICD-10-CM | POA: Diagnosis not present

## 2019-09-30 DIAGNOSIS — Z0000 Encounter for general adult medical examination without abnormal findings: Secondary | ICD-10-CM | POA: Diagnosis not present

## 2019-09-30 DIAGNOSIS — Z1389 Encounter for screening for other disorder: Secondary | ICD-10-CM | POA: Diagnosis not present

## 2019-09-30 DIAGNOSIS — Z85820 Personal history of malignant melanoma of skin: Secondary | ICD-10-CM | POA: Diagnosis not present

## 2019-10-06 ENCOUNTER — Encounter (INDEPENDENT_AMBULATORY_CARE_PROVIDER_SITE_OTHER): Payer: Medicare Other | Admitting: Ophthalmology

## 2019-10-20 ENCOUNTER — Ambulatory Visit (INDEPENDENT_AMBULATORY_CARE_PROVIDER_SITE_OTHER): Payer: Medicare Other | Admitting: Ophthalmology

## 2019-10-20 ENCOUNTER — Other Ambulatory Visit: Payer: Self-pay

## 2019-10-20 ENCOUNTER — Encounter (INDEPENDENT_AMBULATORY_CARE_PROVIDER_SITE_OTHER): Payer: Self-pay | Admitting: Ophthalmology

## 2019-10-20 DIAGNOSIS — H353221 Exudative age-related macular degeneration, left eye, with active choroidal neovascularization: Secondary | ICD-10-CM

## 2019-10-20 DIAGNOSIS — H353133 Nonexudative age-related macular degeneration, bilateral, advanced atrophic without subfoveal involvement: Secondary | ICD-10-CM

## 2019-10-20 MED ORDER — BEVACIZUMAB CHEMO INJECTION 1.25MG/0.05ML SYRINGE FOR KALEIDOSCOPE
1.2500 mg | INTRAVITREAL | Status: AC | PRN
  Administered 2019-10-20: 10:00:00 1.25 mg via INTRAVITREAL

## 2019-10-20 NOTE — Progress Notes (Signed)
10/20/2019     CHIEF COMPLAINT Patient presents for Retina Follow Up   HISTORY OF PRESENT ILLNESS: Dana Randall is a 69 y.o. female who presents to the clinic today for:   HPI    Retina Follow Up    Patient presents with  Wet AMD.  In left eye.  Duration of 10 weeks.  Since onset it is stable.          Comments    10 week follow up - OCT OU, Poss Avastin OS Patient denies change in vision and overall has no complaints.        Last edited by Gerda Diss on 10/20/2019  8:11 AM. (History)      Referring physician: Lavone Orn, MD [address] [place],  [place] [id number]  HISTORICAL INFORMATION:   Selected notes from the [facility]: No current outpatient medications on file. (Ophthalmic Drugs)   No current facility-administered medications for this visit. (Ophthalmic Drugs)   Current Outpatient Medications (Other)  Medication Sig  . aspirin 81 MG chewable tablet Chew 81 mg by mouth daily.  Marland Kitchen atorvastatin (LIPITOR) 20 MG tablet Take 20 mg by mouth daily.  . Multiple Vitamins-Minerals (ICAPS AREDS 2 PO) Take 1 mg by mouth daily.  . Omega-3 Fatty Acids (FISH OIL) 1000 MG CAPS Take 1,000 mg by mouth daily.  Marland Kitchen omeprazole (PRILOSEC) 20 MG capsule Take 20 mg by mouth daily.   No current facility-administered medications for this visit. (Other)      REVIEW OF SYSTEMS:    ALLERGIES Allergies  Allergen Reactions  . Codeine Rash    PAST MEDICAL HISTORY History reviewed. No pertinent past medical history. Past Surgical History:  Procedure Laterality Date  . CATARACT EXTRACTION Right 2021   Dr. Prudencio Burly  . CATARACT EXTRACTION Left 2020   Dr. Prudencio Burly    FAMILY HISTORY History reviewed. No pertinent family history.  SOCIAL HISTORY Social History   Tobacco Use  . Smoking status: Never Smoker  . Smokeless tobacco: Never Used  Substance Use Topics  . Alcohol use: No  . Drug use: No          OPHTHALMIC EXAM:  Base Eye Exam    Visual Acuity (Snellen - Linear)      Right Left   Dist [place] 20/20-1 20/400   Dist ph [place]  NI       Tonometry (Tonopen, 8:14 AM)      Right Left   Pressure 17 16       Pupils      Pupils Dark Light Shape React APD   Right PERRL 3 3 Round Minimal None   Left PERRL 3 3 Round Minimal None       Visual Fields (Counting fingers)      Left Right    Full Full       Extraocular Movement      Right Left    Full Full       Neuro/Psych    Oriented x3: Yes   Mood/Affect: Normal       Dilation    Left eye: 1.0% Mydriacyl, 2.5% Phenylephrine @ 8:14 AM        Slit Lamp and Fundus Exam    External Exam      Right Left   External Normal Normal       Slit Lamp Exam      Right Left   Lids/Lashes  Normal Normal   Conjunctiva/Sclera White and quiet White and quiet   Cornea Clear Clear   Anterior Chamber Deep and quiet Deep and quiet   Iris Round and reactive Round and reactive   Lens Posterior chamber intraocular lens Posterior chamber intraocular lens   Anterior Vitreous Normal Normal       Fundus Exam      Right Left   Posterior Vitreous  Normal   Disc  Peripapillary atrophy   C/D Ratio  0.7   Macula  Atrophy, Pigmented atrophy, Retinal pigment epithelial atrophy,, with region of retinal thickening temporal to the foveal avascular zone.  This area represents extra foveal disciform scar in the clinicians macula   Vessels  Normal   Periphery  Normal          IMAGING AND PROCEDURES  Imaging and Procedures for 10/20/19  OCT, Retina - OU - Both Eyes       Right Eye Quality was good. Scan locations included subfoveal. Central Foveal Thickness: 298. Progression has been stable. Findings include no SRF, no IRF.   Left Eye Quality was good. Scan locations included subfoveal. Central Foveal Thickness: 380. Progression has been stable. Findings include cystoid macular edema, subretinal fluid, intraretinal fluid, choroidal  neovascular membrane, disciform scar.   Notes OD stable with no evidence of CNVM.  OS, disciform macular scar in the region temporally overlying intraretinal fluid and CME.  This area is controlled so far a 10-week interval post Avastin.  We will repeat today and examination in 3 months       Intravitreal Injection, Pharmacologic Agent - OS - Left Eye       Time Out 10/20/2019. 9:34 AM. Confirmed correct patient, procedure, site, and patient consented.   Anesthesia Topical anesthesia was used. Anesthetic medications included Akten 3.5%.   Procedure Preparation included Ofloxacin , 10% betadine to eyelids.   Injection:  1.25 mg Bevacizumab (AVASTIN) SOLN   NDC: [address]-3, Lot: [id number]   Route: Intravitreal, Site: Left Eye, Waste: 0 mg  Post-op Post injection exam found visual acuity of at least counting fingers. The patient tolerated the procedure well. There were no complications. The patient received written and verbal post procedure care education. Post injection medications were not given.                 ASSESSMENT/PLAN:  No problem-specific Assessment & Plan notes found for this encounter.      ICD-10-CM   1. Exudative age-related macular degeneration of left eye with active choroidal neovascularization (HCC)  H35.3221 OCT, Retina - OU - Both Eyes    Intravitreal Injection, Pharmacologic Agent - OS - Left Eye    Bevacizumab (AVASTIN) SOLN 1.25 mg  2. 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  We will repeat intravitreal Avastin OS today and examination in 3 months.  2.  3.  Ophthalmic Meds Ordered this visit:  Meds ordered this encounter  Medications  . Bevacizumab (AVASTIN) SOLN 1.25 mg       Return in about 3 months (around 01/20/2020) for dilate, OS, AVASTIN OCT, DILATE OU.  There are no Patient Instructions on file for this visit.   Explained the diagnoses, plan, and  follow up with the patient and they expressed understanding.  Patient expressed understanding of the importance of proper follow up care.   Clent Demark Barnabas Henriques M.D. Diseases & Surgery of the Retina and Vitreous Retina & Diabetic [facility] 10/20/19  Abbreviations: M myopia (nearsighted); A astigmatism; H hyperopia (farsighted); P presbyopia; Mrx spectacle prescription;  CTL contact lenses; OD right eye; OS left eye; OU both eyes  XT exotropia; ET esotropia; PEK punctate epithelial keratitis; PEE punctate epithelial erosions; DES dry eye syndrome; MGD meibomian gland dysfunction; ATs artificial tears; PFAT's preservative free artificial tears; [place] nuclear sclerotic cataract; PSC posterior subcapsular cataract; ERM epi-retinal membrane; PVD posterior vitreous detachment; RD retinal detachment; DM diabetes mellitus; DR diabetic retinopathy; NPDR non-proliferative diabetic retinopathy; PDR proliferative diabetic retinopathy; CSME clinically significant macular edema; DME diabetic macular edema; dbh dot blot hemorrhages; CWS cotton wool spot; POAG primary open angle glaucoma; C/D cup-to-disc ratio; HVF humphrey visual field; GVF goldmann visual field; OCT optical coherence tomography; IOP intraocular pressure; BRVO Branch retinal vein occlusion; CRVO central retinal vein occlusion; CRAO central retinal artery occlusion; BRAO branch retinal artery occlusion; RT retinal tear; SB scleral buckle; PPV pars plana vitrectomy; VH Vitreous hemorrhage; PRP panretinal laser photocoagulation; IVK intravitreal kenalog; VMT vitreomacular traction; MH Macular hole;  NVD neovascularization of the disc; NVE neovascularization elsewhere; AREDS age related eye disease study; ARMD age related macular degeneration; POAG primary open angle glaucoma; EBMD epithelial/anterior basement membrane dystrophy; ACIOL anterior chamber intraocular lens; IOL intraocular lens; PCIOL posterior chamber intraocular lens; Phaco/IOL  phacoemulsification with intraocular lens placement; [place] photorefractive keratectomy; LASIK laser assisted in situ keratomileusis; HTN hypertension; DM diabetes mellitus; COPD chronic obstructive pulmonary disease

## 2019-12-01 ENCOUNTER — Telehealth: Payer: Self-pay | Admitting: Radiology

## 2019-12-01 NOTE — Telephone Encounter (Signed)
error 

## 2019-12-16 DIAGNOSIS — B0052 Herpesviral keratitis: Secondary | ICD-10-CM | POA: Diagnosis not present

## 2019-12-16 DIAGNOSIS — Z961 Presence of intraocular lens: Secondary | ICD-10-CM | POA: Diagnosis not present

## 2019-12-16 DIAGNOSIS — H401111 Primary open-angle glaucoma, right eye, mild stage: Secondary | ICD-10-CM | POA: Diagnosis not present

## 2019-12-22 DIAGNOSIS — B0052 Herpesviral keratitis: Secondary | ICD-10-CM | POA: Diagnosis not present

## 2019-12-22 DIAGNOSIS — H16101 Unspecified superficial keratitis, right eye: Secondary | ICD-10-CM | POA: Diagnosis not present

## 2020-01-03 DIAGNOSIS — H16101 Unspecified superficial keratitis, right eye: Secondary | ICD-10-CM | POA: Diagnosis not present

## 2020-01-19 ENCOUNTER — Ambulatory Visit (INDEPENDENT_AMBULATORY_CARE_PROVIDER_SITE_OTHER): Payer: Medicare Other | Admitting: Ophthalmology

## 2020-01-19 ENCOUNTER — Other Ambulatory Visit: Payer: Self-pay

## 2020-01-19 ENCOUNTER — Encounter (INDEPENDENT_AMBULATORY_CARE_PROVIDER_SITE_OTHER): Payer: Self-pay | Admitting: Ophthalmology

## 2020-01-19 DIAGNOSIS — H353222 Exudative age-related macular degeneration, left eye, with inactive choroidal neovascularization: Secondary | ICD-10-CM

## 2020-01-19 DIAGNOSIS — H353221 Exudative age-related macular degeneration, left eye, with active choroidal neovascularization: Secondary | ICD-10-CM | POA: Diagnosis not present

## 2020-01-19 DIAGNOSIS — H16301 Unspecified interstitial keratitis, right eye: Secondary | ICD-10-CM | POA: Diagnosis not present

## 2020-01-19 NOTE — Assessment & Plan Note (Signed)
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  No active lesion noted.  Will observe today.

## 2020-01-19 NOTE — Progress Notes (Signed)
01/19/2020     CHIEF COMPLAINT Patient presents for Retina Follow Up   HISTORY OF PRESENT ILLNESS: Dana Randall is a 69 y.o. female who presents to the clinic today for:   HPI    Retina Follow Up    Patient presents with  Wet AMD.  In left eye.  Severity is moderate.  Duration of 3 months.  Since onset it is stable.  I, the attending physician,  performed the HPI with the patient and updated documentation appropriately.          Comments    3 Month f\u OU. Possible Avastin OS. OCT  Pt states she recently saw Dr. Prudencio Burly for a virus in OD. She also had a bad episode of vertigo. Pt was given Prednisolone and Valtrex temporarally. Denies any changes in vision.       Last edited by Tilda Franco on 01/19/2020  8:08 AM. (History)      Referring physician: Lavone Orn, MD [place] [place],  [place] [address]  HISTORICAL INFORMATION:   Selected notes from the MEDICAL RECORD NUMBER       CURRENT MEDICATIONS: Current Outpatient Medications (Ophthalmic Drugs)  Medication Sig  . prednisoLONE acetate (PRED FORTE) 1 % ophthalmic suspension Place 1 drop into the right eye 2 (two) times daily.   No current facility-administered medications for this visit. (Ophthalmic Drugs)   Current Outpatient Medications (Other)  Medication Sig  . aspirin 81 MG chewable tablet Chew 81 mg by mouth daily.  Marland Kitchen atorvastatin (LIPITOR) 20 MG tablet Take 20 mg by mouth daily.  . meclizine (ANTIVERT) 25 MG tablet Take by mouth.  . Multiple Vitamins-Minerals (ICAPS AREDS 2 PO) Take 1 mg by mouth daily.  . Omega-3 Fatty Acids (FISH OIL) 1000 MG CAPS Take 1,000 mg by mouth daily.  Marland Kitchen omeprazole (PRILOSEC) 20 MG capsule Take 20 mg by mouth daily.  . valACYclovir (VALTREX) 1000 MG tablet Take 1,000 mg by mouth 3 (three) times daily.   No current facility-administered medications for this visit. (Other)      REVIEW OF SYSTEMS:    ALLERGIES Allergies  Allergen Reactions  .  Codeine Rash    PAST MEDICAL HISTORY History reviewed. No pertinent past medical history. Past Surgical History:  Procedure Laterality Date  . CATARACT EXTRACTION Right 2021   Dr. Prudencio Burly  . CATARACT EXTRACTION Left 2020   Dr. Prudencio Burly    FAMILY HISTORY History reviewed. No pertinent family history.  SOCIAL HISTORY Social History   Tobacco Use  . Smoking status: Never Smoker  . Smokeless tobacco: Never Used  Substance Use Topics  . Alcohol use: No  . Drug use: No         OPHTHALMIC EXAM:  Base Eye Exam    Visual Acuity (Snellen - Linear)      Right Left   Dist [place] 20/20 -1 20/400   Dist ph [place]  NI       Tonometry (Tonopen, 8:12 AM)      Right Left   Pressure 12 10       Pupils      Pupils Dark Light Shape React APD   Right PERRL 3 3 Round Minimal None   Left PERRL 3 3 Round Minimal None       Visual Fields (Counting fingers)      Left Right    Full Full       Neuro/Psych    Oriented x3: Yes  Mood/Affect: Normal       Dilation    Both eyes: 1.0% Mydriacyl, 2.5% Phenylephrine @ 8:12 AM        Slit Lamp and Fundus Exam    External Exam      Right Left   External Normal Normal       Slit Lamp Exam      Right Left   Lids/Lashes Normal Normal   Conjunctiva/Sclera White and quiet White and quiet   Cornea Clear centrally, but  there is a mid stromal whitish infiltrate, 2.5 mm curvilinear shape gentle to the limbus, 1.5 to 2 mm from the limbus, with no epithelial defect and limbal mid stromal neovascularization of the cornea, at 3 meridian. Clear   Anterior Chamber Deep and quiet Deep and quiet   Iris Round and reactive Round and reactive   Lens Posterior chamber intraocular lens Posterior chamber intraocular lens   Anterior Vitreous Normal Normal       Fundus Exam      Right Left   Posterior Vitreous Normal Normal   Disc Peripapillary atrophy Peripapillary atrophy   C/D Ratio 0.65 0.7   Macula Atrophy, Early age related macular degeneration,  Retinal pigment epithelial atrophy, Mottling, Retinal pigment epithelial mottling, no macular thickening, no exudates, no hemorrhage Atrophy, Pigmented atrophy, Retinal pigment epithelial atrophy,, with region of retinal thickening temporal to the foveal avascular zone.     Vessels Normal Normal   Periphery Normal Normal          IMAGING AND PROCEDURES  Imaging and Procedures for 01/19/20  OCT, Retina - OU - Both Eyes       Right Eye Quality was good. Scan locations included subfoveal. Central Foveal Thickness: 287. Progression has been stable. Findings include normal foveal contour, retinal drusen .   Left Eye Quality was good. Scan locations included subfoveal. Central Foveal Thickness: 363. Findings include outer retinal atrophy, central retinal atrophy, disciform scar.   Notes No active maculopathy OD.  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  No active lesion noted.  Will observe today.  Stromal keratitis, right Currently under the care of Dr. Katy Apo, no active epithelial defect.      ICD-10-CM   1. Exudative age-related macular degeneration of left eye with active choroidal neovascularization (HCC)  H35.3221 OCT, Retina - OU - Both Eyes  2. Exudative age-related macular degeneration of left eye with inactive choroidal neovascularization ([place])  H35.3222   3. Stromal keratitis, right  H16.301     1.  Follow-up with Dr. Katy Apo as scheduled  2.  No specific therapy warranted for the left eye, disciform scar at this time  3.  Ophthalmic Meds Ordered this visit:  No orders of the defined types were placed in this encounter.      Return in about 3 months (around 04/19/2020) for DILATE OU, OCT,  COLOR FP.  Patient Instructions  Patient asked to report any new onset visual acuity declines difficulties or distortion particular the right eye    Explained the diagnoses, plan, and follow up with the patient and they expressed understanding.  Patient expressed understanding of the importance of proper follow up care.   Clent Demark Jayshon Dommer M.D.  Diseases & Surgery of the Retina and Vitreous Retina & Diabetic [place] 01/19/20     Abbreviations: M myopia (nearsighted); A astigmatism; H hyperopia (farsighted); P presbyopia; Mrx spectacle prescription;  CTL contact lenses; OD right eye; OS left eye; OU both eyes  XT exotropia; ET esotropia; PEK punctate epithelial keratitis; PEE punctate epithelial erosions; DES dry eye syndrome; MGD meibomian gland dysfunction; ATs artificial tears; PFAT's preservative free artificial tears; [place] nuclear sclerotic cataract; PSC posterior subcapsular cataract; ERM epi-retinal membrane; PVD posterior vitreous detachment; RD retinal detachment; DM diabetes mellitus; DR diabetic retinopathy; NPDR non-proliferative diabetic retinopathy; PDR proliferative diabetic retinopathy; CSME clinically significant macular edema; DME diabetic macular edema; dbh dot blot hemorrhages; CWS cotton wool spot; POAG primary open angle glaucoma; C/D cup-to-disc ratio; HVF humphrey visual field; GVF goldmann visual field; OCT optical coherence tomography; IOP intraocular pressure; BRVO Branch retinal vein occlusion; CRVO central retinal vein occlusion; CRAO central retinal artery occlusion; BRAO branch retinal artery occlusion; RT retinal tear; SB scleral buckle; PPV pars plana vitrectomy; VH Vitreous hemorrhage; PRP panretinal laser photocoagulation; IVK intravitreal kenalog; VMT vitreomacular traction; MH Macular hole;  NVD neovascularization of the disc; NVE neovascularization elsewhere; AREDS age related eye disease study; ARMD age related macular degeneration; POAG primary open angle  glaucoma; EBMD epithelial/anterior basement membrane dystrophy; ACIOL anterior chamber intraocular lens; IOL intraocular lens; PCIOL posterior chamber intraocular lens; Phaco/IOL phacoemulsification with intraocular lens placement; [place] photorefractive keratectomy; LASIK laser assisted in situ keratomileusis; HTN hypertension; DM diabetes mellitus; COPD chronic obstructive pulmonary disease

## 2020-01-19 NOTE — Assessment & Plan Note (Signed)
Currently under the care of Dr. Katy Apo, no active epithelial defect.

## 2020-01-19 NOTE — Patient Instructions (Signed)
Patient asked to report any new onset visual acuity declines difficulties or distortion particular the right eye

## 2020-02-03 DIAGNOSIS — Z23 Encounter for immunization: Secondary | ICD-10-CM | POA: Diagnosis not present

## 2020-02-15 DIAGNOSIS — Z23 Encounter for immunization: Secondary | ICD-10-CM | POA: Diagnosis not present

## 2020-02-18 DIAGNOSIS — E7800 Pure hypercholesterolemia, unspecified: Secondary | ICD-10-CM | POA: Diagnosis not present

## 2020-02-18 DIAGNOSIS — H409 Unspecified glaucoma: Secondary | ICD-10-CM | POA: Diagnosis not present

## 2020-03-20 DIAGNOSIS — C4372 Malignant melanoma of left lower limb, including hip: Secondary | ICD-10-CM | POA: Diagnosis not present

## 2020-04-19 ENCOUNTER — Other Ambulatory Visit: Payer: Self-pay

## 2020-04-19 ENCOUNTER — Encounter (INDEPENDENT_AMBULATORY_CARE_PROVIDER_SITE_OTHER): Payer: Self-pay | Admitting: Ophthalmology

## 2020-04-19 ENCOUNTER — Ambulatory Visit (INDEPENDENT_AMBULATORY_CARE_PROVIDER_SITE_OTHER): Payer: Medicare Other | Admitting: Ophthalmology

## 2020-04-19 DIAGNOSIS — H353221 Exudative age-related macular degeneration, left eye, with active choroidal neovascularization: Secondary | ICD-10-CM | POA: Diagnosis not present

## 2020-04-19 DIAGNOSIS — H353111 Nonexudative age-related macular degeneration, right eye, early dry stage: Secondary | ICD-10-CM | POA: Diagnosis not present

## 2020-04-19 NOTE — Progress Notes (Signed)
04/19/2020     CHIEF COMPLAINT Patient presents for Retina Follow Up (3 Month Wet AMD f\u. OCT and FP/Pt states no changes in vision. Pt states she sees little white lights in OD at night time. Pt states she sees it for a few a seconds when she is in bed.)   HISTORY OF PRESENT ILLNESS: Dana Randall is a 69 y.o. female who presents to the clinic today for:   HPI    Retina Follow Up    Patient presents with  Wet AMD.  In left eye.  Severity is moderate.  Duration of 3 months.  Since onset it is stable.  I, the attending physician,  performed the HPI with the patient and updated documentation appropriately. Additional comments: 3 Month Wet AMD f\u. OCT and FP Pt states no changes in vision. Pt states she sees little white lights in OD at night time. Pt states she sees it for a few a seconds when she is in bed.       Last edited by Elyse Jarvis on 04/19/2020  8:06 AM. (History)      Referring physician: Kirby Funk, MD [address] [place],  [place] [address]  HISTORICAL INFORMATION:   Selected notes from the MEDICAL RECORD NUMBER       CURRENT MEDICATIONS: Current Outpatient Medications (Ophthalmic Drugs)  Medication Sig  . prednisoLONE acetate (PRED FORTE) 1 % ophthalmic suspension Place 1 drop into the right eye 2 (two) times daily.   No current facility-administered medications for this visit. (Ophthalmic Drugs)   Current Outpatient Medications (Other)  Medication Sig  . aspirin 81 MG chewable tablet Chew 81 mg by mouth daily.  Marland Kitchen atorvastatin (LIPITOR) 20 MG tablet Take 20 mg by mouth daily.  . meclizine (ANTIVERT) 25 MG tablet Take by mouth.  . Multiple Vitamins-Minerals (ICAPS AREDS 2 PO) Take 1 mg by mouth daily.  . Omega-3 Fatty Acids (FISH OIL) 1000 MG CAPS Take 1,000 mg by mouth daily.  Marland Kitchen omeprazole (PRILOSEC) 20 MG capsule Take 20 mg by mouth daily.  . valACYclovir (VALTREX) 1000 MG tablet Take 1,000 mg by mouth 3 (three) times daily.    No current facility-administered medications for this visit. (Other)      REVIEW OF SYSTEMS:    ALLERGIES Allergies  Allergen Reactions  . Codeine Rash    PAST MEDICAL HISTORY History reviewed. No pertinent past medical history. Past Surgical History:  Procedure Laterality Date  . CATARACT EXTRACTION Right 2021   Dr. Randon Goldsmith  . CATARACT EXTRACTION Left 2020   Dr. Randon Goldsmith    FAMILY HISTORY History reviewed. No pertinent family history.  SOCIAL HISTORY Social History   Tobacco Use  . Smoking status: Never Smoker  . Smokeless tobacco: Never Used  Substance Use Topics  . Alcohol use: No  . Drug use: No         OPHTHALMIC EXAM:  Base Eye Exam    Visual Acuity (Snellen - Linear)      Right Left   Dist [place] 20/20 -1 20/400   Dist ph [place]  NI       Tonometry (Tonopen, 8:10 AM)      Right Left   Pressure 14 14       Pupils      Pupils Dark Light Shape React APD   Right PERRL 3 3 Round Minimal None   Left PERRL 3 3 Round Minimal Trace       Visual Fields (  Counting fingers)      Left Right    Full Full       Neuro/Psych    Oriented x3: Yes   Mood/Affect: Normal       Dilation    Both eyes: 1.0% Mydriacyl, 2.5% Phenylephrine @ 8:10 AM        Slit Lamp and Fundus Exam    External Exam      Right Left   External Normal Normal       Slit Lamp Exam      Right Left   Lids/Lashes Normal Normal   Conjunctiva/Sclera White and quiet White and quiet   Cornea Clear centrally, but  there is a mid stromal whitish infiltrate, 2.5 mm curvilinear shape gentle to the limbus, 1.5 to 2 mm from the limbus, with no epithelial defect and limbal mid stromal neovascularization of the cornea, at 3 meridian. Clear   Anterior Chamber Deep and quiet Deep and quiet   Iris Round and reactive Round and reactive   Lens Posterior chamber intraocular lens Posterior chamber intraocular lens   Anterior Vitreous Normal Normal       Fundus Exam      Right Left   Posterior  Vitreous Normal Normal   Disc Peripapillary atrophy Peripapillary atrophy   C/D Ratio 0.65 0.75   Macula Atrophy, Early age related macular degeneration, Retinal pigment epithelial atrophy, Mottling, Retinal pigment epithelial mottling, no macular thickening, no exudates, no hemorrhage, Pigmented atrophy, mild, Hard drusen Atrophy, central submacular pigmented atrophy, Retinal pigment epithelial atrophy,, with region of retinal thickening temporal to the foveal avascular zone.  , Disciform scar inactive   Vessels Normal Normal   Periphery Normal Normal          IMAGING AND PROCEDURES  Imaging and Procedures for 04/19/20  OCT, Retina - OU - Both Eyes       Right Eye Quality was good. Scan locations included subfoveal. Central Foveal Thickness: 291. Progression has been stable. Findings include normal foveal contour, retinal drusen .   Left Eye Quality was good. Scan locations included subfoveal. Central Foveal Thickness: 404. Progression has been stable. Findings include outer retinal atrophy, central retinal atrophy, disciform scar, abnormal foveal contour.   Notes No active maculopathy OD.  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       Color Fundus Photography Optos - OU - Both Eyes       Right Eye Progression has been stable. Disc findings include increased cup to disc ratio, thinning of rim. Macula : drusen. Vessels : normal observations. Periphery : normal observations.   Left Eye Progression has been stable. Disc findings include increased cup to disc ratio, thinning of rim. Macula : drusen, geographic atrophy.   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 Checking an Amsler Grid daily with advice to return immediately should a distortion develop, was given to the patient. The patient 's smoking status now and in the past was determined and advice based on the AREDS study was provided regarding the  consumption of antioxidant supplements. AREDS 2 vitamin formulation was recommended. Consumption of dark leafy vegetables and fresh fruits of various colors was recommended. Treatment modalities for wet macular degeneration particularly the use of intravitreal injections of anti-blood vessel growth factors was discussed with the patient. Avastin, Lucentis, and Eylea are the available options. On occasion, therapy includes the use of photodynamic therapy and thermal laser. Stressed to the patient do not rub eyes.  Patient was advised to check Amsler Grid daily and return immediately if changes are noted. Instructions on using the grid were given to the patient. All patient questions were answered.      ICD-10-CM   1. Exudative age-related macular degeneration of left eye with active choroidal neovascularization (HCC)  H35.3221 OCT, Retina - OU - Both Eyes    Color Fundus Photography Optos - OU - Both Eyes  2. 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  No signs of active disease in either eye.  We will continue to observe extend interval of follow-up  2.  3.  Ophthalmic Meds Ordered this visit:  No orders of the defined types were placed in this encounter.      Return in about 6 months (around 10/18/2020) for DILATE OU, OCT.  There are no Patient Instructions on file for this visit.   Explained the diagnoses, plan, and follow up with the patient and they expressed understanding.  Patient expressed understanding of the importance of proper follow up  care.   Clent Demark Harold Mattes M.D. Diseases & Surgery of the Retina and Vitreous Retina & Diabetic [place] 04/19/20     Abbreviations: M myopia (nearsighted); A astigmatism; H hyperopia (farsighted); P presbyopia; Mrx spectacle prescription;  CTL contact lenses; OD right eye; OS left eye; OU both eyes  XT exotropia; ET esotropia; PEK punctate epithelial keratitis; PEE punctate epithelial erosions; DES dry eye syndrome; MGD meibomian gland dysfunction; ATs artificial tears; PFAT's preservative free artificial tears; [place] nuclear sclerotic cataract; PSC posterior subcapsular cataract; ERM epi-retinal membrane; PVD posterior vitreous detachment; RD retinal detachment; DM diabetes mellitus; DR diabetic retinopathy; NPDR non-proliferative diabetic retinopathy; PDR proliferative diabetic retinopathy; CSME clinically significant macular edema; DME diabetic macular edema; dbh dot blot hemorrhages; CWS cotton wool spot; POAG primary open angle glaucoma; C/D cup-to-disc ratio; HVF humphrey visual field; GVF goldmann visual field; OCT optical coherence tomography; IOP intraocular pressure; BRVO Branch retinal vein occlusion; CRVO central retinal vein occlusion; CRAO central retinal artery occlusion; BRAO branch retinal artery occlusion; RT retinal tear; SB scleral buckle; PPV pars plana vitrectomy; VH Vitreous hemorrhage; PRP panretinal laser photocoagulation; IVK intravitreal kenalog; VMT vitreomacular traction; MH Macular hole;  NVD neovascularization of the disc; NVE neovascularization elsewhere; AREDS age related eye disease study; ARMD age related macular degeneration; POAG primary open angle glaucoma; EBMD epithelial/anterior basement membrane dystrophy; ACIOL anterior chamber intraocular lens; IOL intraocular lens; PCIOL posterior chamber intraocular lens; Phaco/IOL phacoemulsification with intraocular lens placement; [place] photorefractive keratectomy; LASIK laser assisted in situ keratomileusis; HTN hypertension;  DM diabetes mellitus; COPD chronic obstructive pulmonary disease

## 2020-04-19 NOTE — Assessment & Plan Note (Signed)

## 2020-05-05 DIAGNOSIS — H401411 Capsular glaucoma with pseudoexfoliation of lens, right eye, mild stage: Secondary | ICD-10-CM | POA: Diagnosis not present

## 2020-05-05 DIAGNOSIS — H16101 Unspecified superficial keratitis, right eye: Secondary | ICD-10-CM | POA: Diagnosis not present

## 2020-06-08 ENCOUNTER — Other Ambulatory Visit: Payer: Self-pay | Admitting: Internal Medicine

## 2020-06-08 DIAGNOSIS — Z1231 Encounter for screening mammogram for malignant neoplasm of breast: Secondary | ICD-10-CM

## 2020-06-21 DIAGNOSIS — H9312 Tinnitus, left ear: Secondary | ICD-10-CM | POA: Diagnosis not present

## 2020-06-23 DIAGNOSIS — Z1283 Encounter for screening for malignant neoplasm of skin: Secondary | ICD-10-CM | POA: Diagnosis not present

## 2020-06-23 DIAGNOSIS — X32XXXD Exposure to sunlight, subsequent encounter: Secondary | ICD-10-CM | POA: Diagnosis not present

## 2020-06-23 DIAGNOSIS — Z08 Encounter for follow-up examination after completed treatment for malignant neoplasm: Secondary | ICD-10-CM | POA: Diagnosis not present

## 2020-06-23 DIAGNOSIS — L989 Disorder of the skin and subcutaneous tissue, unspecified: Secondary | ICD-10-CM | POA: Diagnosis not present

## 2020-06-23 DIAGNOSIS — D225 Melanocytic nevi of trunk: Secondary | ICD-10-CM | POA: Diagnosis not present

## 2020-06-23 DIAGNOSIS — D485 Neoplasm of uncertain behavior of skin: Secondary | ICD-10-CM | POA: Diagnosis not present

## 2020-06-23 DIAGNOSIS — Z85828 Personal history of other malignant neoplasm of skin: Secondary | ICD-10-CM | POA: Diagnosis not present

## 2020-06-23 DIAGNOSIS — L570 Actinic keratosis: Secondary | ICD-10-CM | POA: Diagnosis not present

## 2020-07-14 DIAGNOSIS — D485 Neoplasm of uncertain behavior of skin: Secondary | ICD-10-CM | POA: Diagnosis not present

## 2020-07-14 DIAGNOSIS — D2272 Melanocytic nevi of left lower limb, including hip: Secondary | ICD-10-CM | POA: Diagnosis not present

## 2020-07-26 DIAGNOSIS — H9312 Tinnitus, left ear: Secondary | ICD-10-CM | POA: Insufficient documentation

## 2020-07-26 DIAGNOSIS — H903 Sensorineural hearing loss, bilateral: Secondary | ICD-10-CM | POA: Diagnosis not present

## 2020-07-31 ENCOUNTER — Other Ambulatory Visit: Payer: Self-pay

## 2020-07-31 ENCOUNTER — Ambulatory Visit
Admission: RE | Admit: 2020-07-31 | Discharge: 2020-07-31 | Disposition: A | Discharge status: Home / Self Care | Payer: Medicare Other | Source: Ambulatory Visit | Attending: Internal Medicine | Admitting: Internal Medicine

## 2020-07-31 DIAGNOSIS — Z1231 Encounter for screening mammogram for malignant neoplasm of breast: Secondary | ICD-10-CM | POA: Diagnosis not present

## 2020-08-07 DIAGNOSIS — H409 Unspecified glaucoma: Secondary | ICD-10-CM | POA: Diagnosis not present

## 2020-08-07 DIAGNOSIS — E7800 Pure hypercholesterolemia, unspecified: Secondary | ICD-10-CM | POA: Diagnosis not present

## 2020-10-02 DIAGNOSIS — Z1389 Encounter for screening for other disorder: Secondary | ICD-10-CM | POA: Diagnosis not present

## 2020-10-02 DIAGNOSIS — Z85820 Personal history of malignant melanoma of skin: Secondary | ICD-10-CM | POA: Diagnosis not present

## 2020-10-02 DIAGNOSIS — R7301 Impaired fasting glucose: Secondary | ICD-10-CM | POA: Diagnosis not present

## 2020-10-02 DIAGNOSIS — E7800 Pure hypercholesterolemia, unspecified: Secondary | ICD-10-CM | POA: Diagnosis not present

## 2020-10-02 DIAGNOSIS — H3530 Unspecified macular degeneration: Secondary | ICD-10-CM | POA: Diagnosis not present

## 2020-10-02 DIAGNOSIS — Z0000 Encounter for general adult medical examination without abnormal findings: Secondary | ICD-10-CM | POA: Diagnosis not present

## 2020-10-02 DIAGNOSIS — H409 Unspecified glaucoma: Secondary | ICD-10-CM | POA: Diagnosis not present

## 2020-10-19 ENCOUNTER — Encounter (INDEPENDENT_AMBULATORY_CARE_PROVIDER_SITE_OTHER): Payer: Self-pay | Admitting: Ophthalmology

## 2020-10-19 ENCOUNTER — Ambulatory Visit (INDEPENDENT_AMBULATORY_CARE_PROVIDER_SITE_OTHER): Payer: Medicare Other | Admitting: Ophthalmology

## 2020-10-19 ENCOUNTER — Other Ambulatory Visit: Payer: Self-pay

## 2020-10-19 DIAGNOSIS — H353221 Exudative age-related macular degeneration, left eye, with active choroidal neovascularization: Secondary | ICD-10-CM

## 2020-10-19 DIAGNOSIS — H35372 Puckering of macula, left eye: Secondary | ICD-10-CM | POA: Insufficient documentation

## 2020-10-19 DIAGNOSIS — H353111 Nonexudative age-related macular degeneration, right eye, early dry stage: Secondary | ICD-10-CM

## 2020-10-19 MED ORDER — BEVACIZUMAB 2.5 MG/0.1ML IZ SOSY
2.5000 mg | PREFILLED_SYRINGE | INTRAVITREAL | Status: AC | PRN
  Administered 2020-10-19: 09:00:00 2.5 mg via INTRAVITREAL

## 2020-10-19 NOTE — Assessment & Plan Note (Signed)
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  We will monitor after injection with antivegF.  If not improving may consider vitrectomy membrane peel

## 2020-10-19 NOTE — Assessment & Plan Note (Signed)
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

## 2020-10-19 NOTE — Progress Notes (Signed)
10/19/2020     CHIEF COMPLAINT Patient presents for Retina Follow Up (6 month fu OU and OCT/Pt states VA OU stable since last visit. Pt denies FOL, floaters, or ocular pain OU. /Pt reports using Brimonidine BID OD and Latanoprost QHS OD/)   HISTORY OF PRESENT ILLNESS: Dana Randall is a 70 y.o. female who presents to the clinic today for:   HPI     Retina Follow Up           Diagnosis: Wet AMD   Laterality: both eyes   Onset: 6 months ago   Severity: mild   Duration: 6 months   Course: stable   Comments: 6 month fu OU and OCT Pt states VA OU stable since last visit. Pt denies FOL, floaters, or ocular pain OU.  Pt reports using Brimonidine BID OD and Latanoprost QHS OD        Last edited by Kendra Opitz, COA on 10/19/2020  8:02 AM.      Referring physician: Lavone Orn, MD [address] [place],  [place] [address]  HISTORICAL INFORMATION:   Selected notes from the MEDICAL RECORD NUMBER       CURRENT MEDICATIONS: Current Outpatient Medications (Ophthalmic Drugs)  Medication Sig   prednisoLONE acetate (PRED FORTE) 1 % ophthalmic suspension Place 1 drop into the right eye 2 (two) times daily.   No current facility-administered medications for this visit. (Ophthalmic Drugs)   Current Outpatient Medications (Other)  Medication Sig   aspirin 81 MG chewable tablet Chew 81 mg by mouth daily.   atorvastatin (LIPITOR) 20 MG tablet Take 20 mg by mouth daily.   meclizine (ANTIVERT) 25 MG tablet Take by mouth.   Multiple Vitamins-Minerals (ICAPS AREDS 2 PO) Take 1 mg by mouth daily.   Omega-3 Fatty Acids (FISH OIL) 1000 MG CAPS Take 1,000 mg by mouth daily.   omeprazole (PRILOSEC) 20 MG capsule Take 20 mg by mouth daily.   valACYclovir (VALTREX) 1000 MG tablet Take 1,000 mg by mouth 3 (three) times daily.   No current facility-administered medications for this visit. (Other)      REVIEW OF SYSTEMS:    ALLERGIES Allergies  Allergen Reactions    Codeine Rash    PAST MEDICAL HISTORY History reviewed. No pertinent past medical history. Past Surgical History:  Procedure Laterality Date   CATARACT EXTRACTION Right 2021   Dr. Prudencio Burly   CATARACT EXTRACTION Left 2020   Dr. Prudencio Burly    FAMILY HISTORY History reviewed. No pertinent family history.  SOCIAL HISTORY Social History   Tobacco Use   Smoking status: Never   Smokeless tobacco: Never  Substance Use Topics   Alcohol use: No   Drug use: No         OPHTHALMIC EXAM:  Base Eye Exam     Visual Acuity (ETDRS)       Right Left   Dist [place] 20/20 -2 CF at 3'         Tonometry (Tonopen, 8:06 AM)       Right Left   Pressure 17 15         Pupils       Pupils Dark Light Shape React APD   Right PERRL 3 3 Round Minimal None   Left PERRL 3 3 Round Minimal Trace         Visual Fields (Counting fingers)       Left Right    Full Full  Extraocular Movement       Right Left    Full Full         Neuro/Psych     Oriented x3: Yes   Mood/Affect: Normal         Dilation     Both eyes: 1.0% Mydriacyl, 2.5% Phenylephrine @ 8:06 AM           Slit Lamp and Fundus Exam     External Exam       Right Left   External Normal Normal         Slit Lamp Exam       Right Left   Lids/Lashes Normal Normal   Conjunctiva/Sclera White and quiet White and quiet   Cornea Clear centrally, but  there is a mid stromal whitish infiltrate, 2.5 mm curvilinear shape gentle to the limbus, 1.5 to 2 mm from the limbus, with no epithelial defect and limbal mid stromal neovascularization of the cornea, at 3 meridian. Clear   Anterior Chamber Deep and quiet Deep and quiet   Iris Round and reactive Round and reactive   Lens Posterior chamber intraocular lens Posterior chamber intraocular lens   Anterior Vitreous Normal Normal         Fundus Exam       Right Left   Posterior Vitreous Normal Normal   Disc Peripapillary atrophy Peripapillary atrophy    C/D Ratio 0.65 0.75   Macula Atrophy, Early age related macular degeneration, Retinal pigment epithelial atrophy, Mottling, Retinal pigment epithelial mottling, no macular thickening, no exudates, no hemorrhage, Pigmented atrophy, mild, Hard drusen Atrophy, central submacular pigmented atrophy, Retinal pigment epithelial atrophy,, with region of retinal thickening temporal to the foveal avascular zone.  , Disciform scar active   Vessels Normal Normal   Periphery Normal Normal            IMAGING AND PROCEDURES  Imaging and Procedures for 10/19/20  OCT, Retina - OU - Both Eyes       Right Eye Quality was good. Scan locations included subfoveal. Central Foveal Thickness: 291. Progression has been stable. Findings include normal foveal contour, retinal drusen .   Left Eye Quality was good. Scan locations included subfoveal. Central Foveal Thickness: 498. Progression has been stable. Findings include outer retinal atrophy, central retinal atrophy, disciform scar, abnormal foveal contour, intraretinal fluid, subretinal scarring, cystoid macular edema, epiretinal membrane.   Notes No active maculopathy OD.  OS with large disciform scar severe epiretinal membrane but also now With intraretinal fluid w and CME  Will need treatment left eye     Intravitreal Injection, Pharmacologic Agent - OS - Left Eye       Time Out 10/19/2020. 8:40 AM. Confirmed correct patient, procedure, site, and patient consented.   Anesthesia Topical anesthesia was used. Anesthetic medications included [place] 3.5%.   Procedure Preparation included Ofloxacin , 10% betadine to eyelids. A 30 gauge needle was used.   Injection: 2.5 mg bevacizumab 2.5 MG/0.1ML   Route: Intravitreal, Site: Left Eye   NDC: [phone], Lot: [id number]   Post-op Post injection exam found visual acuity of at least counting fingers. The patient tolerated the procedure well. There were no complications. The patient received  written and verbal post procedure care education. Post injection medications were not given.              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  We will monitor after injection with antivegF.  If not improving may consider vitrectomy membrane peel     ICD-10-CM   1. Exudative age-related macular degeneration of left eye with active choroidal neovascularization (HCC)  H35.3221 OCT, Retina - OU - Both Eyes    Intravitreal Injection, Pharmacologic Agent - OS - Left Eye    bevacizumab (AVASTIN) SOSY 2.5 mg    2. 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 Macular pucker, left eye  H35.372       1.  OS with newly active CNVM with intraretinal fluid and and CME.  We will commence antivegF now after no treatment for over a year.    2.  Epiretinal membrane also newly present may be contributing to the CME but we will look to see further response from antivegF and if CME resolves, epiretinal membrane may not be attributing to CME  3.  Dilate OS next likely injection Avastin  Ophthalmic Meds Ordered this visit:  Meds ordered this encounter  Medications   bevacizumab (AVASTIN) SOSY 2.5 mg       Return in about 6 weeks (around 11/30/2020) for dilate, OS, AVASTIN OCT.  There are no Patient Instructions on file for this visit.   Explained the diagnoses, plan, and follow up with the patient and they expressed understanding.  Patient expressed understanding of the importance of proper follow up care.   Clent Demark Cranford Blessinger M.D. Diseases & Surgery of the Retina and Vitreous Retina & Diabetic [facility] 10/19/20     Abbreviations: M myopia (nearsighted); A astigmatism; H hyperopia (farsighted); P presbyopia; Mrx spectacle prescription;  CTL contact lenses; OD right eye; OS left eye; OU both eyes  XT exotropia; ET esotropia; PEK punctate epithelial keratitis; PEE punctate epithelial erosions; DES dry eye syndrome; MGD meibomian gland dysfunction; ATs artificial tears; PFAT's preservative free artificial tears; [place] nuclear sclerotic cataract; PSC posterior subcapsular cataract; ERM epi-retinal membrane; PVD posterior vitreous detachment; RD retinal detachment; DM diabetes mellitus; DR diabetic retinopathy; NPDR non-proliferative diabetic retinopathy; PDR proliferative diabetic retinopathy; CSME clinically significant macular edema; DME diabetic macular edema; dbh dot blot hemorrhages; CWS cotton wool spot; POAG primary open angle glaucoma; C/D cup-to-disc ratio; HVF humphrey visual field; GVF goldmann visual field; OCT optical coherence tomography; IOP intraocular pressure; BRVO Branch retinal vein occlusion; CRVO central retinal vein occlusion; CRAO central retinal artery occlusion; BRAO branch retinal artery occlusion; RT retinal tear; SB scleral buckle; PPV pars plana vitrectomy; VH Vitreous hemorrhage; PRP panretinal laser photocoagulation; IVK intravitreal kenalog; VMT vitreomacular traction; MH Macular hole;  NVD neovascularization of the disc; NVE neovascularization elsewhere; AREDS age related eye disease study; ARMD age related macular degeneration; POAG primary open angle glaucoma; EBMD epithelial/anterior basement membrane dystrophy; ACIOL anterior chamber intraocular lens; IOL intraocular lens; PCIOL posterior chamber intraocular lens; Phaco/IOL phacoemulsification with intraocular lens placement; [place] photorefractive keratectomy; LASIK laser assisted in situ keratomileusis; HTN hypertension; DM diabetes mellitus; COPD chronic obstructive pulmonary disease

## 2020-10-27 DIAGNOSIS — E7800 Pure hypercholesterolemia, unspecified: Secondary | ICD-10-CM | POA: Diagnosis not present

## 2020-10-27 DIAGNOSIS — H409 Unspecified glaucoma: Secondary | ICD-10-CM | POA: Diagnosis not present

## 2020-11-29 ENCOUNTER — Encounter (INDEPENDENT_AMBULATORY_CARE_PROVIDER_SITE_OTHER): Payer: Self-pay | Admitting: Ophthalmology

## 2020-11-29 ENCOUNTER — Ambulatory Visit (INDEPENDENT_AMBULATORY_CARE_PROVIDER_SITE_OTHER): Payer: Medicare Other | Admitting: Ophthalmology

## 2020-11-29 ENCOUNTER — Other Ambulatory Visit: Payer: Self-pay

## 2020-11-29 DIAGNOSIS — H353111 Nonexudative age-related macular degeneration, right eye, early dry stage: Secondary | ICD-10-CM

## 2020-11-29 DIAGNOSIS — H353221 Exudative age-related macular degeneration, left eye, with active choroidal neovascularization: Secondary | ICD-10-CM | POA: Diagnosis not present

## 2020-11-29 DIAGNOSIS — H353222 Exudative age-related macular degeneration, left eye, with inactive choroidal neovascularization: Secondary | ICD-10-CM

## 2020-11-29 DIAGNOSIS — H35372 Puckering of macula, left eye: Secondary | ICD-10-CM | POA: Diagnosis not present

## 2020-11-29 DIAGNOSIS — H353124 Nonexudative age-related macular degeneration, left eye, advanced atrophic with subfoveal involvement: Secondary | ICD-10-CM | POA: Diagnosis not present

## 2020-11-29 NOTE — Assessment & Plan Note (Signed)
Accounts for acuity 

## 2020-11-29 NOTE — Assessment & Plan Note (Signed)
Epiretinal membrane present yet focal vitreal foveal traction is present this is what is triggering the CME which is not going to be amenable to therapy.  There is no fixation to projected small light being with 90 diopter examination anywhere in the macula that has significant chorioretinal scarring or atrophy and thus the foveal region that is being distorted with secondary CME is not viable vision.  I have explained the patient that antivegF therapy will not correct this.  If further vitreomacular traction develops threatening complete macular detachment with this could spread to the remainder of the retina that she is not interested in a count of surgical intervention with current visual acuity and scotoma is limited by the previous chorioretinal scarring.  I have to agree.

## 2020-11-29 NOTE — Assessment & Plan Note (Signed)
Intraretinal fluid and CME is secondary to vitreal foveal traction and epiretinal membrane in an area with no viable acuity no vision perceived.

## 2020-11-29 NOTE — Progress Notes (Signed)
11/29/2020     CHIEF COMPLAINT Patient presents for Retina Follow Up (6 week fu os oct avastin os/Pt states VA OU stable since last visit. Pt denies new FOL, floaters, or ocular pain OU. /Pt reports using Brimonidine BID OD and Latanoprost QHS OD and refresh tears ou qam./)   HISTORY OF PRESENT ILLNESS: Dana Randall is a 70 y.o. female who presents to the clinic today for:   HPI     Retina Follow Up           Diagnosis: Wet AMD   Laterality: both eyes   Onset: 6 weeks ago   Severity: mild   Duration: 6 weeks   Course: stable   Comments: 6 week fu os oct avastin os Pt states VA OU stable since last visit. Pt denies new FOL, floaters, or ocular pain OU.  Pt reports using Brimonidine BID OD and Latanoprost QHS OD and refresh tears ou qam.        Last edited by Laurin Coder, COA on 11/29/2020  8:16 AM.      Referring physician: Lavone Orn, MD [address] [place],  [place] [address]  HISTORICAL INFORMATION:   Selected notes from the MEDICAL RECORD NUMBER       CURRENT MEDICATIONS: Current Outpatient Medications (Ophthalmic Drugs)  Medication Sig   prednisoLONE acetate (PRED FORTE) 1 % ophthalmic suspension Place 1 drop into the right eye 2 (two) times daily.   No current facility-administered medications for this visit. (Ophthalmic Drugs)   Current Outpatient Medications (Other)  Medication Sig   aspirin 81 MG chewable tablet Chew 81 mg by mouth daily.   atorvastatin (LIPITOR) 20 MG tablet Take 20 mg by mouth daily.   meclizine (ANTIVERT) 25 MG tablet Take by mouth.   Multiple Vitamins-Minerals (ICAPS AREDS 2 PO) Take 1 mg by mouth daily.   Omega-3 Fatty Acids (FISH OIL) 1000 MG CAPS Take 1,000 mg by mouth daily.   omeprazole (PRILOSEC) 20 MG capsule Take 20 mg by mouth daily.   valACYclovir (VALTREX) 1000 MG tablet Take 1,000 mg by mouth 3 (three) times daily.   No current facility-administered medications for this visit. (Other)       REVIEW OF SYSTEMS:    ALLERGIES Allergies  Allergen Reactions   Codeine Rash    PAST MEDICAL HISTORY History reviewed. No pertinent past medical history. Past Surgical History:  Procedure Laterality Date   CATARACT EXTRACTION Right 2021   Dr. Prudencio Burly   CATARACT EXTRACTION Left 2020   Dr. Prudencio Burly    FAMILY HISTORY History reviewed. No pertinent family history.  SOCIAL HISTORY Social History   Tobacco Use   Smoking status: Never   Smokeless tobacco: Never  Substance Use Topics   Alcohol use: No   Drug use: No         OPHTHALMIC EXAM:  Base Eye Exam     Visual Acuity (ETDRS)       Right Left   Dist [place] 20/20 -2 CF at 3'   Dist ph [place]  20/400         Tonometry (Tonopen, 8:24 AM)       Right Left   Pressure 10 14         Pupils       Pupils Dark Light React APD   Right PERRL 4 3 Minimal None   Left PERRL 4 3 Minimal None         Visual Fields (Counting fingers)  Left Right    Full Full         Extraocular Movement       Right Left    Full Full         Neuro/Psych     Oriented x3: Yes   Mood/Affect: Normal         Dilation     Left eye: 1.0% Mydriacyl, 2.5% Phenylephrine @ 8:24 AM           Slit Lamp and Fundus Exam     External Exam       Right Left   External Normal Normal         Slit Lamp Exam       Right Left   Lids/Lashes Normal Normal   Conjunctiva/Sclera White and quiet White and quiet   Cornea Clear Clear   Anterior Chamber Deep and quiet Deep and quiet   Iris Round and reactive Round and reactive   Lens Posterior chamber intraocular lens Posterior chamber intraocular lens   Anterior Vitreous Normal Normal         Fundus Exam       Right Left   Posterior Vitreous  Normal   Disc  Peripapillary atrophy   C/D Ratio  0.7   Macula  Atrophy, Pigmented atrophy subfoveal approximately 12 disc areas in size with no fixation to small target projection except at edges of normal  choriocapillaris , Retinal pigment epithelial atrophy,, with region of retinal thickening temporal to the foveal avascular zone. Does not fixate on projected target, except at edges of atrophy,   Vessels  Normal   Periphery  Normal            IMAGING AND PROCEDURES  Imaging and Procedures for 11/29/20  OCT, Retina - OU - Both Eyes       Right Eye Quality was good. Scan locations included subfoveal. Central Foveal Thickness: 290. Progression has been stable. Findings include normal foveal contour, retinal drusen .   Left Eye Quality was good. Scan locations included subfoveal. Central Foveal Thickness: 464. Progression has been stable. Findings include outer retinal atrophy, central retinal atrophy, disciform scar, abnormal foveal contour, intraretinal fluid, subretinal scarring, cystoid macular edema, epiretinal membrane, vitreous traction.   Notes No active maculopathy OD.  OS with large disciform scar severe epiretinal membrane but also now With intraretinal fluid w and CME, which apparently now is clearly from a form from vitreal foveal traction epiretinal Lifting of the inner retina.  Secondary CME.  This is not amenable to intravitreal antivegF             ASSESSMENT/PLAN:  Macular pucker, left eye Epiretinal membrane present yet focal vitreal foveal traction is present this is what is triggering the CME which is not going to be amenable to therapy.  There is no fixation to projected small light being with 90 diopter examination anywhere in the macula that has significant chorioretinal scarring or atrophy and thus the foveal region that is being distorted with secondary CME is not viable vision.  I have explained the patient that antivegF therapy will not correct this.  If further vitreomacular traction develops threatening complete macular detachment with this could spread to the remainder of the retina that she is not interested in a count of surgical intervention with  current visual acuity and scotoma is limited by the previous chorioretinal scarring.  I have to agree.  Exudative age-related macular degeneration of left eye with inactive choroidal neovascularization (HCC) Intraretinal fluid  and CME is secondary to vitreal foveal traction and epiretinal membrane in an area with no viable acuity no vision perceived.  Advanced nonexudative age-related macular degeneration of left eye with subfoveal involvement Accounts for acuity     ICD-10-CM   1. Exudative age-related macular degeneration of left eye with active choroidal neovascularization (HCC)  H35.3221 OCT, Retina - OU - Both Eyes    2. 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 Macular pucker, left eye  H35.372     4. Exudative age-related macular degeneration of left eye with inactive choroidal neovascularization ([place])  H35.3222     5. Advanced nonexudative age-related macular degeneration of left eye with subfoveal involvement  H35.3124       1.  OS with stable if not slightly increasing intraretinal fluid and CME but this is focal in the temporal aspect of the fovea not from CNVM but rather from epiretinal membrane with focal vitreal foveal traction as delineated by OCT.  This has become more pronounced over the last few visits and is quite visible now.  This region does not have viable acuity no visual functioning and thus it is not appropriate to recommend surgical intervention at this time.  We do need to monitor the anatomy such that if this spreads or worsens and threatens peripheral vision surgical intervention with vitrectomy may be required.  2.  Follow Dr. Katy Apo as scheduled  3.  No injection of antivegF required today  Ophthalmic Meds Ordered this visit:  No orders of the defined types were placed in this encounter.      Return in about 3 months (around 03/01/2021) for dilate, OS, OCT.  There are no Patient Instructions on file  for this visit.   Explained the diagnoses, plan, and follow up with the patient and they expressed understanding.  Patient expressed understanding of the importance of proper follow up care.   Clent Demark Starnisha Batrez M.D. Diseases & Surgery of the Retina and Vitreous Retina & Diabetic [place] 11/29/20     Abbreviations: M myopia (nearsighted); A astigmatism; H hyperopia (farsighted); P presbyopia; Mrx spectacle prescription;  CTL contact lenses; OD right eye; OS left eye; OU both eyes  XT exotropia; ET esotropia; PEK punctate epithelial keratitis; PEE punctate epithelial erosions; DES dry eye syndrome; MGD meibomian gland dysfunction; ATs artificial tears; PFAT's preservative free artificial tears; [place] nuclear sclerotic cataract; PSC posterior subcapsular cataract; ERM epi-retinal membrane; PVD posterior vitreous detachment; RD retinal detachment; DM diabetes mellitus; DR diabetic retinopathy; NPDR non-proliferative diabetic retinopathy; PDR proliferative diabetic retinopathy; CSME clinically significant macular edema; DME diabetic macular edema; dbh dot blot hemorrhages; CWS cotton wool spot; POAG primary open angle glaucoma; C/D cup-to-disc ratio; HVF humphrey visual field; GVF goldmann visual field; OCT optical coherence tomography; IOP intraocular pressure; BRVO Branch retinal vein occlusion; CRVO central retinal vein occlusion; CRAO central retinal artery occlusion; BRAO branch retinal artery occlusion; RT retinal tear; SB scleral buckle; PPV pars plana vitrectomy; VH Vitreous hemorrhage; PRP panretinal laser photocoagulation; IVK intravitreal kenalog; VMT vitreomacular traction; MH Macular hole;  NVD neovascularization of the disc; NVE neovascularization elsewhere; AREDS age related eye disease study; ARMD age related macular degeneration; POAG primary open angle glaucoma; EBMD epithelial/anterior basement membrane dystrophy; ACIOL anterior chamber intraocular lens; IOL intraocular lens; PCIOL  posterior chamber intraocular lens; Phaco/IOL phacoemulsification with intraocular lens placement; [place] photorefractive keratectomy; LASIK laser assisted in situ keratomileusis; HTN hypertension; DM diabetes mellitus; COPD chronic obstructive pulmonary disease

## 2020-11-30 ENCOUNTER — Encounter (INDEPENDENT_AMBULATORY_CARE_PROVIDER_SITE_OTHER): Payer: Medicare Other | Admitting: Ophthalmology

## 2020-12-04 DIAGNOSIS — H401411 Capsular glaucoma with pseudoexfoliation of lens, right eye, mild stage: Secondary | ICD-10-CM | POA: Diagnosis not present

## 2020-12-08 DIAGNOSIS — Z1283 Encounter for screening for malignant neoplasm of skin: Secondary | ICD-10-CM | POA: Diagnosis not present

## 2020-12-08 DIAGNOSIS — Z08 Encounter for follow-up examination after completed treatment for malignant neoplasm: Secondary | ICD-10-CM | POA: Diagnosis not present

## 2020-12-08 DIAGNOSIS — Z85820 Personal history of malignant melanoma of skin: Secondary | ICD-10-CM | POA: Diagnosis not present

## 2020-12-08 DIAGNOSIS — D225 Melanocytic nevi of trunk: Secondary | ICD-10-CM | POA: Diagnosis not present

## 2021-01-02 DIAGNOSIS — Z23 Encounter for immunization: Secondary | ICD-10-CM | POA: Diagnosis not present

## 2021-02-05 DIAGNOSIS — E7800 Pure hypercholesterolemia, unspecified: Secondary | ICD-10-CM | POA: Diagnosis not present

## 2021-02-05 DIAGNOSIS — H409 Unspecified glaucoma: Secondary | ICD-10-CM | POA: Diagnosis not present

## 2021-03-01 ENCOUNTER — Encounter (INDEPENDENT_AMBULATORY_CARE_PROVIDER_SITE_OTHER): Payer: Self-pay | Admitting: Ophthalmology

## 2021-03-01 ENCOUNTER — Ambulatory Visit (INDEPENDENT_AMBULATORY_CARE_PROVIDER_SITE_OTHER): Payer: Medicare Other | Admitting: Ophthalmology

## 2021-03-01 ENCOUNTER — Other Ambulatory Visit: Payer: Self-pay

## 2021-03-01 DIAGNOSIS — H43822 Vitreomacular adhesion, left eye: Secondary | ICD-10-CM | POA: Insufficient documentation

## 2021-03-01 DIAGNOSIS — H353221 Exudative age-related macular degeneration, left eye, with active choroidal neovascularization: Secondary | ICD-10-CM

## 2021-03-01 DIAGNOSIS — H26492 Other secondary cataract, left eye: Secondary | ICD-10-CM | POA: Diagnosis not present

## 2021-03-01 MED ORDER — BEVACIZUMAB 2.5 MG/0.1ML IZ SOSY
2.5000 mg | PREFILLED_SYRINGE | INTRAVITREAL | Status: AC | PRN
  Administered 2021-03-01: 2.5 mg via INTRAVITREAL

## 2021-03-01 NOTE — Assessment & Plan Note (Signed)
OS, vitreomacular traction compounding epiretinal membrane onset after December 2021.  Patient still fovea H today with projected small slit being thus very reasonable to offer and suggest vitrectomy release of the vitreal macular traction and peeling of the epiretinal membrane so as to maximize acuity in the left eye attention to prevent progression of vision loss

## 2021-03-01 NOTE — Assessment & Plan Note (Signed)
Minor OS will address soon

## 2021-03-01 NOTE — Progress Notes (Signed)
03/01/2021     CHIEF COMPLAINT Patient presents for  Chief Complaint  Patient presents with   Retina Follow Up      HISTORY OF PRESENT ILLNESS: Dana Randall is a 70 y.o. 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  This started 3 months ago.  Duration of 3 months.  Since onset it is stable.        Comments   3 month f/u OS with OCT and possible Avastin injection  EyeMeds: Brimonidine BID OD Latanoprost QHS OD      Last edited by Reather Littler, COA on 03/01/2021  8:20 AM.      Referring physician: Lavone Orn, MD [address] [place],  [place] [address]  HISTORICAL INFORMATION:   Selected notes from the MEDICAL RECORD NUMBER       CURRENT MEDICATIONS: Current Outpatient Medications (Ophthalmic Drugs)  Medication Sig   prednisoLONE acetate (PRED FORTE) 1 % ophthalmic suspension Place 1 drop into the right eye 2 (two) times daily.   No current facility-administered medications for this visit. (Ophthalmic Drugs)   Current Outpatient Medications (Other)  Medication Sig   aspirin 81 MG chewable tablet Chew 81 mg by mouth daily.   atorvastatin (LIPITOR) 20 MG tablet Take 20 mg by mouth daily.   meclizine (ANTIVERT) 25 MG tablet Take by mouth.   Multiple Vitamins-Minerals (ICAPS AREDS 2 PO) Take 1 mg by mouth daily.   Omega-3 Fatty Acids (FISH OIL) 1000 MG CAPS Take 1,000 mg by mouth daily.   omeprazole (PRILOSEC) 20 MG capsule Take 20 mg by mouth daily.   valACYclovir (VALTREX) 1000 MG tablet Take 1,000 mg by mouth 3 (three) times daily.   No current facility-administered medications for this visit. (Other)      REVIEW OF SYSTEMS:    ALLERGIES Allergies  Allergen Reactions   Codeine Rash    PAST MEDICAL HISTORY History reviewed. No pertinent past medical history. Past Surgical History:  Procedure Laterality Date   CATARACT EXTRACTION Right 2021    Dr. Prudencio Burly   CATARACT EXTRACTION Left 2020   Dr. Prudencio Burly    FAMILY HISTORY History reviewed. No pertinent family history.  SOCIAL HISTORY Social History   Tobacco Use   Smoking status: Never   Smokeless tobacco: Never  Substance Use Topics   Alcohol use: No   Drug use: No         OPHTHALMIC EXAM:  Base Eye Exam     Visual Acuity (ETDRS)       Right Left   Dist [place] 20/20 -1 CF@3ft    Dist ph [place]  20/400         Tonometry (Tonopen, 8:30 AM)       Right Left   Pressure 11 6         Pupils       Pupils Dark Light Shape React APD   Right PERRL 5 4 Round Brisk None   Left PERRL 5 4 Round Brisk None         Visual Fields (Counting fingers)       Left Right    Full Full         Extraocular Movement       Right Left    Full, Ortho Full, Ortho         Neuro/Psych  Oriented x3: Yes   Mood/Affect: Normal         Dilation     Left eye: 1.0% Mydriacyl, 2.5% Phenylephrine @ 8:30 AM           Slit Lamp and Fundus Exam     External Exam       Right Left   External Normal Normal         Slit Lamp Exam       Right Left   Lids/Lashes Normal Normal   Conjunctiva/Sclera White and quiet White and quiet   Cornea Clear Clear   Anterior Chamber Deep and quiet Deep and quiet   Iris Round and reactive Round and reactive   Lens Posterior chamber intraocular lens Posterior chamber intraocular lens, 3+ Posterior capsular opacification   Anterior Vitreous Normal Normal         Fundus Exam       Right Left   Posterior Vitreous  Normal   Disc  Peripapillary atrophy   C/D Ratio  0.7   Macula  Atrophy, Pigmented atrophy subfoveal approximately 12 disc areas in size with no fixation to small target projection except at edges of normal choriocapillaris , Retinal pigment epithelial atrophy,, with region of retinal thickening temporal to the foveal avascular zone. Does not fixate on projected target, except at edges of atrophy,, Epiretinal  membrane,  Foveates on small projected beam   Vessels  Normal   Periphery  Normal            IMAGING AND PROCEDURES  Imaging and Procedures for 03/01/21  OCT, Retina - OU - Both Eyes       Right Eye Quality was good. Scan locations included subfoveal. Central Foveal Thickness: 290. Progression has been stable. Findings include normal foveal contour, retinal drusen .   Left Eye Quality was good. Scan locations included subfoveal. Central Foveal Thickness: 520. Progression has been stable. Findings include outer retinal atrophy, central retinal atrophy, disciform scar, abnormal foveal contour, intraretinal fluid, subretinal scarring, cystoid macular edema, epiretinal membrane, vitreous traction.   Notes No active maculopathy OD.  OS with large disciform scar severe epiretinal membrane but also now With intraretinal fluid  and CME, which apparently now is clearly from a form from vitreal foveal traction epiretinal Lifting of the inner retina.  No VM traction and no ER as recent as December 2021.  These findings were reviewed with the patient.  Patient still fovea H.  Secondary CME.  Likely some aspect of wet AMD OS CME did worsen today thus we will treat with antivegF     Intravitreal Injection, Pharmacologic Agent - OS - Left Eye       Time Out 03/01/2021. 9:12 AM. Confirmed correct patient, procedure, site, and patient consented.   Anesthesia Topical anesthesia was used. Anesthetic medications included Lidocaine 4%.   Procedure Preparation included Ofloxacin , 10% betadine to eyelids. A 30 gauge needle was used.   Injection: 2.5 mg bevacizumab 2.5 MG/0.1ML   Route: Intravitreal, Site: Left Eye   NDC: [phone], Lot: [id number]   Post-op Post injection exam found visual acuity of at least counting fingers. The patient tolerated the procedure well. There were no complications. The patient received written and verbal post procedure care education. Post injection medications  included ocuflox.              ASSESSMENT/PLAN:  Vitreomacular traction syndrome, left OS, vitreomacular traction compounding epiretinal membrane onset after December 2021.  Patient still fovea H today with projected  small slit being thus very reasonable to offer and suggest vitrectomy release of the vitreal macular traction and peeling of the epiretinal membrane so as to maximize acuity in the left eye attention to prevent progression of vision loss  Exudative age-related macular degeneration of left eye with active choroidal neovascularization (HCC) OS, with increased CME now some 4 months post most recent injection.  Explained to the patient that the injections take care of the CME component of the wet AMD but have no effect on the vitreomacular traction, the mechanical component of macular traction ongoing in her left eye contributing to progression of vision loss  Posterior capsular opacification, left eye Minor OS will address soon     ICD-10-CM   1. Exudative age-related macular degeneration of left eye with active choroidal neovascularization (HCC)  H35.3221 OCT, Retina - OU - Both Eyes    Intravitreal Injection, Pharmacologic Agent - OS - Left Eye    bevacizumab (AVASTIN) SOSY 2.5 mg    2. Vitreomacular traction syndrome, left  H43.822     3. Posterior capsular opacification, left eye  H26.492       1.  Repeat injection Avastin OS today to control CNVM component of disease  2.  Risk benefits and discussion of vitrectomy and membrane peel release of VMT reviewed with the patient and demonstrations and booklet reviewed.  The patient that she would have a similar experience as cataract surgery and that surgical time would be 15 to 20 minutes to repair and to initiate repair of this condition.  3.  Patient will contact the office should she like to proceed,  [id number], LMAC, [place], OS  Ophthalmic Meds Ordered this visit:  Meds ordered this encounter  Medications   bevacizumab  (AVASTIN) SOSY 2.5 mg       Return in about 8 weeks (around 04/26/2021), or ,,, Patient should consider vitrectomy membrane peel release of VMT - [id number], for dilate, OS, AVASTIN OCT.  There are no Patient Instructions on file for this visit.   Explained the diagnoses, plan, and follow up with the patient and they expressed understanding.  Patient expressed understanding of the importance of proper follow up care.   Clent Demark Jobin Montelongo M.D. Diseases & Surgery of the Retina and Vitreous [facility] [place] 03/01/21     Abbreviations: M myopia (nearsighted); A astigmatism; H hyperopia (farsighted); P presbyopia; Mrx spectacle prescription;  CTL contact lenses; OD right eye; OS left eye; OU both eyes  XT exotropia; ET esotropia; PEK punctate epithelial keratitis; PEE punctate epithelial erosions; DES dry eye syndrome; MGD meibomian gland dysfunction; ATs artificial tears; PFAT's preservative free artificial tears; [place] nuclear sclerotic cataract; PSC posterior subcapsular cataract; ERM epi-retinal membrane; PVD posterior vitreous detachment; RD retinal detachment; DM diabetes mellitus; DR diabetic retinopathy; NPDR non-proliferative diabetic retinopathy; PDR proliferative diabetic retinopathy; CSME clinically significant macular edema; DME diabetic macular edema; dbh dot blot hemorrhages; CWS cotton wool spot; POAG primary open angle glaucoma; C/D cup-to-disc ratio; HVF humphrey visual field; GVF goldmann visual field; OCT optical coherence tomography; IOP intraocular pressure; BRVO Branch retinal vein occlusion; CRVO central retinal vein occlusion; CRAO central retinal artery occlusion; BRAO branch retinal artery occlusion; RT retinal tear; SB scleral buckle; PPV pars plana vitrectomy; VH Vitreous hemorrhage; PRP panretinal laser photocoagulation; IVK intravitreal kenalog; VMT vitreomacular traction; MH Macular hole;  NVD neovascularization of the disc; NVE neovascularization elsewhere; AREDS age  related eye disease study; ARMD age related macular degeneration; POAG primary open angle glaucoma; EBMD epithelial/anterior basement  membrane dystrophy; ACIOL anterior chamber intraocular lens; IOL intraocular lens; PCIOL posterior chamber intraocular lens; Phaco/IOL phacoemulsification with intraocular lens placement; [place] photorefractive keratectomy; LASIK laser assisted in situ keratomileusis; HTN hypertension; DM diabetes mellitus; COPD chronic obstructive pulmonary disease

## 2021-03-01 NOTE — Assessment & Plan Note (Signed)
OS, with increased CME now some 4 months post most recent injection.  Explained to the patient that the injections take care of the CME component of the wet AMD but have no effect on the vitreomacular traction, the mechanical component of macular traction ongoing in her left eye contributing to progression of vision loss

## 2021-04-26 ENCOUNTER — Encounter (INDEPENDENT_AMBULATORY_CARE_PROVIDER_SITE_OTHER): Payer: Medicare Other | Admitting: Ophthalmology

## 2021-05-02 ENCOUNTER — Other Ambulatory Visit: Payer: Self-pay

## 2021-05-02 ENCOUNTER — Ambulatory Visit (INDEPENDENT_AMBULATORY_CARE_PROVIDER_SITE_OTHER): Payer: Medicare Other | Admitting: Ophthalmology

## 2021-05-02 ENCOUNTER — Encounter (INDEPENDENT_AMBULATORY_CARE_PROVIDER_SITE_OTHER): Payer: Self-pay | Admitting: Ophthalmology

## 2021-05-02 DIAGNOSIS — H353211 Exudative age-related macular degeneration, right eye, with active choroidal neovascularization: Secondary | ICD-10-CM | POA: Diagnosis not present

## 2021-05-02 DIAGNOSIS — H353221 Exudative age-related macular degeneration, left eye, with active choroidal neovascularization: Secondary | ICD-10-CM | POA: Diagnosis not present

## 2021-05-02 DIAGNOSIS — H35372 Puckering of macula, left eye: Secondary | ICD-10-CM | POA: Diagnosis not present

## 2021-05-02 DIAGNOSIS — H353124 Nonexudative age-related macular degeneration, left eye, advanced atrophic with subfoveal involvement: Secondary | ICD-10-CM

## 2021-05-02 MED ORDER — BEVACIZUMAB 2.5 MG/0.1ML IZ SOSY
2.5000 mg | PREFILLED_SYRINGE | INTRAVITREAL | Status: AC | PRN
  Administered 2021-05-02: 2.5 mg via INTRAVITREAL

## 2021-05-02 NOTE — Assessment & Plan Note (Signed)
Chronic intraretinal fluid could be from the underlying atrophy and CNVM but also from the overlying epiretinal membrane

## 2021-05-02 NOTE — Assessment & Plan Note (Signed)
Incidental new finding today without symptoms per the patient nonetheless OCT today discloses large area superior to [place] with vascularized PED and subretinal fluid.  We will need to urgently commence with therapy today antivegF and this monocular patient

## 2021-05-02 NOTE — Assessment & Plan Note (Signed)
Atrophy OS centrally accounts for acuity

## 2021-05-02 NOTE — Progress Notes (Signed)
05/02/2021     CHIEF COMPLAINT Patient presents for  Chief Complaint  Patient presents with   Retina Follow Up      HISTORY OF PRESENT ILLNESS: Dana Randall is a 71 y.o. female who presents to the clinic today for:   HPI     Retina Follow Up           Diagnosis: Wet AMD   Laterality: left eye   Onset: 9 weeks ago   Severity: mild   Duration: 9 weeks   Course: stable         Comments   9 week fu OS and oct and Avastin OS, No interval change in symptoms in the right eye.  Pt states VA OU stable since last visit. Pt denies FOL, floaters, or ocular pain OU.   Pt reports using Brimonidine BID OD and Latanoprost QHS OD        Last edited by Hurman Horn, MD on 05/02/2021  8:35 AM.      Referring physician: Lavone Orn, MD [address] [place],  [place] [address]  HISTORICAL INFORMATION:   Selected notes from the MEDICAL RECORD NUMBER       CURRENT MEDICATIONS: Current Outpatient Medications (Ophthalmic Drugs)  Medication Sig   prednisoLONE acetate (PRED FORTE) 1 % ophthalmic suspension Place 1 drop into the right eye 2 (two) times daily.   No current facility-administered medications for this visit. (Ophthalmic Drugs)   Current Outpatient Medications (Other)  Medication Sig   aspirin 81 MG chewable tablet Chew 81 mg by mouth daily.   atorvastatin (LIPITOR) 20 MG tablet Take 20 mg by mouth daily.   meclizine (ANTIVERT) 25 MG tablet Take by mouth.   Multiple Vitamins-Minerals (ICAPS AREDS 2 PO) Take 1 mg by mouth daily.   Omega-3 Fatty Acids (FISH OIL) 1000 MG CAPS Take 1,000 mg by mouth daily.   omeprazole (PRILOSEC) 20 MG capsule Take 20 mg by mouth daily.   valACYclovir (VALTREX) 1000 MG tablet Take 1,000 mg by mouth 3 (three) times daily.   No current facility-administered medications for this visit. (Other)      REVIEW OF SYSTEMS:    ALLERGIES Allergies  Allergen Reactions   Codeine Rash    PAST MEDICAL  HISTORY History reviewed. No pertinent past medical history. Past Surgical History:  Procedure Laterality Date   CATARACT EXTRACTION Right 2021   Dr. Prudencio Burly   CATARACT EXTRACTION Left 2020   Dr. Prudencio Burly    FAMILY HISTORY History reviewed. No pertinent family history.  SOCIAL HISTORY Social History   Tobacco Use   Smoking status: Never   Smokeless tobacco: Never  Substance Use Topics   Alcohol use: No   Drug use: No         OPHTHALMIC EXAM:  Base Eye Exam     Visual Acuity (ETDRS)       Right Left   Dist [place] 20/20 CF at 3'   Dist ph [place]  NI         Tonometry (Tonopen, 8:02 AM)       Right Left   Pressure 14 13         Pupils       Pupils Dark Light Shape React APD   Right PERRL 5 4 Round Brisk None   Left PERRL 5 4 Round Brisk None         Visual Fields (Counting fingers)       Left Right  Full Full         Extraocular Movement       Right Left    Full, Ortho Full, Ortho         Neuro/Psych     Oriented x3: Yes   Mood/Affect: Normal         Dilation     Left eye: 1.0% Mydriacyl, 2.5% Phenylephrine @ 8:02 AM           Slit Lamp and Fundus Exam     External Exam       Right Left   External Normal Normal         Slit Lamp Exam       Right Left   Lids/Lashes Normal Normal   Conjunctiva/Sclera White and quiet White and quiet   Cornea Clear Clear   Anterior Chamber Deep and quiet Deep and quiet   Iris Round and reactive Round and reactive   Lens Posterior chamber intraocular lens Posterior chamber intraocular lens, 3+ Posterior capsular opacification   Anterior Vitreous Normal Normal         Fundus Exam       Right Left   Posterior Vitreous  Normal   Disc  Peripapillary atrophy   C/D Ratio  0.7   Macula  Atrophy, Pigmented atrophy subfoveal approximately 12 disc areas in size with no fixation to small target projection except at edges of normal choriocapillaris, Retinal pigment epithelial atrophy, with  region of retinal thickening temporal to the foveal avascular zone. Does not fixate on projected target, except at edges of atrophy, Epiretinal membrane, Foveates on small projected beam   Vessels  Normal   Periphery  Normal            IMAGING AND PROCEDURES  Imaging and Procedures for 05/02/21  OCT, Retina - OU - Both Eyes       Right Eye Quality was good. Scan locations included subfoveal. Central Foveal Thickness: 295. Progression has been stable. Findings include normal foveal contour, retinal drusen , subretinal scarring, subretinal fluid, intraretinal fluid.   Left Eye Quality was good. Scan locations included subfoveal. Central Foveal Thickness: 520. Progression has been stable. Findings include outer retinal atrophy, central retinal atrophy, disciform scar, abnormal foveal contour, intraretinal fluid, subretinal scarring, cystoid macular edema, epiretinal membrane, vitreous traction.   Notes OD, new region of vascularized PED superior to [place] with subretinal fluid and early intraretinal fluid.  These findings were disclosed and reviewed with the patient signifying new CNVM OD  OS with large disciform scar severe epiretinal membrane but also now With intraretinal fluid  and CME, which apparently now is clearly from a form from vitreal foveal traction epiretinal Lifting of the inner retina.  No VM traction and no ER as recent as December 2021.  These findings were reviewed with the patient.  Patient still fovea H.  Secondary CME.  Likely some aspect of wet AMD OS CME did worsen today thus we will treat with antivegF     Intravitreal Injection, Pharmacologic Agent - OD - Right Eye       Time Out 05/02/2021. 8:37 AM. Confirmed correct patient, procedure, site, and patient consented.   Anesthesia Topical anesthesia was used. Anesthetic medications included Lidocaine 4%.   Procedure Preparation included 5% betadine to ocular surface, 10% betadine to eyelids. A 30 gauge needle  was used.   Injection: 2.5 mg bevacizumab 2.5 MG/0.1ML   Route: Intravitreal, Site: Right Eye   NDC: [phone], Lot: [id number]   Post-op  Post injection exam found visual acuity of at least counting fingers. The patient tolerated the procedure well. There were no complications. The patient received written and verbal post procedure care education. Post injection medications were not given.              ASSESSMENT/PLAN:  Advanced nonexudative age-related macular degeneration of left eye with subfoveal involvement Atrophy OS centrally accounts for acuity  Exudative age-related macular degeneration of left eye with active choroidal neovascularization (HCC) Chronic intraretinal fluid could be from the underlying atrophy and CNVM but also from the overlying epiretinal membrane  Macular pucker, left eye Might consider surgical intervention left eye for epiretinal membrane simply to diminish the ongoing CME and potentially Avoid the need for ongoing antivegF therapy  Exudative age-related macular degeneration of right eye with active choroidal neovascularization ([place]) Incidental new finding today without symptoms per the patient nonetheless OCT today discloses large area superior to [place] with vascularized PED and subretinal fluid.  We will need to urgently commence with therapy today antivegF and this monocular patient     ICD-10-CM   1. Exudative age-related macular degeneration of right eye with active choroidal neovascularization (HCC)  H35.3211 Intravitreal Injection, Pharmacologic Agent - OD - Right Eye    bevacizumab (AVASTIN) SOSY 2.5 mg    2. Exudative age-related macular degeneration of left eye with active choroidal neovascularization (HCC)  H35.3221 OCT, Retina - OU - Both Eyes    CANCELED: Intravitreal Injection, Pharmacologic Agent - OS - Left Eye    3. Advanced nonexudative age-related macular degeneration of left eye with subfoveal involvement  H35.3124     4. Macular  pucker, left eye  H35.372       1.  OS overall stable findings at 9 weeks.  Intraretinal fluid with likely CNVM or CME from underlying subjacent RPE atrophy from dry AMD.  Also role of epiretinal membrane with traction could also be playing in this case and may consider vitrectomy membrane peel left eye simply to avoid ongoing therapy of antivegF OS  2.  OD most importantly incidental finding today on OCT disclosing evidence of wet AMD with CNVM secondary to PED vascularized, asymptomatic commence with intravitreal antivegF now and follow-up again in 5 weeks  3.  Ophthalmic Meds Ordered this visit:  Meds ordered this encounter  Medications   bevacizumab (AVASTIN) SOSY 2.5 mg       Return in about 5 weeks (around 06/06/2021) for DILATE OU, AVASTIN OCT, OD.  There are no Patient Instructions on file for this visit.   Explained the diagnoses, plan, and follow up with the patient and they expressed understanding.  Patient expressed understanding of the importance of proper follow up care.   Clent Demark Ionna Avis M.D. Diseases & Surgery of the Retina and Vitreous [facility] [place] 05/02/21     Abbreviations: M myopia (nearsighted); A astigmatism; H hyperopia (farsighted); P presbyopia; Mrx spectacle prescription;  CTL contact lenses; OD right eye; OS left eye; OU both eyes  XT exotropia; ET esotropia; PEK punctate epithelial keratitis; PEE punctate epithelial erosions; DES dry eye syndrome; MGD meibomian gland dysfunction; ATs artificial tears; PFAT's preservative free artificial tears; [place] nuclear sclerotic cataract; PSC posterior subcapsular cataract; ERM epi-retinal membrane; PVD posterior vitreous detachment; RD retinal detachment; DM diabetes mellitus; DR diabetic retinopathy; NPDR non-proliferative diabetic retinopathy; PDR proliferative diabetic retinopathy; CSME clinically significant macular edema; DME diabetic macular edema; dbh dot blot hemorrhages; CWS cotton wool spot;  POAG primary open angle glaucoma; C/D cup-to-disc  ratio; HVF humphrey visual field; GVF goldmann visual field; OCT optical coherence tomography; IOP intraocular pressure; BRVO Branch retinal vein occlusion; CRVO central retinal vein occlusion; CRAO central retinal artery occlusion; BRAO branch retinal artery occlusion; RT retinal tear; SB scleral buckle; PPV pars plana vitrectomy; VH Vitreous hemorrhage; PRP panretinal laser photocoagulation; IVK intravitreal kenalog; VMT vitreomacular traction; MH Macular hole;  NVD neovascularization of the disc; NVE neovascularization elsewhere; AREDS age related eye disease study; ARMD age related macular degeneration; POAG primary open angle glaucoma; EBMD epithelial/anterior basement membrane dystrophy; ACIOL anterior chamber intraocular lens; IOL intraocular lens; PCIOL posterior chamber intraocular lens; Phaco/IOL phacoemulsification with intraocular lens placement; [place] photorefractive keratectomy; LASIK laser assisted in situ keratomileusis; HTN hypertension; DM diabetes mellitus; COPD chronic obstructive pulmonary disease

## 2021-05-02 NOTE — Assessment & Plan Note (Signed)
Might consider surgical intervention left eye for epiretinal membrane simply to diminish the ongoing CME and potentially Avoid the need for ongoing antivegF therapy

## 2021-05-22 DIAGNOSIS — C44519 Basal cell carcinoma of skin of other part of trunk: Secondary | ICD-10-CM | POA: Diagnosis not present

## 2021-05-22 DIAGNOSIS — Z08 Encounter for follow-up examination after completed treatment for malignant neoplasm: Secondary | ICD-10-CM | POA: Diagnosis not present

## 2021-05-22 DIAGNOSIS — Z85820 Personal history of malignant melanoma of skin: Secondary | ICD-10-CM | POA: Diagnosis not present

## 2021-05-22 DIAGNOSIS — Z1283 Encounter for screening for malignant neoplasm of skin: Secondary | ICD-10-CM | POA: Diagnosis not present

## 2021-05-22 DIAGNOSIS — D225 Melanocytic nevi of trunk: Secondary | ICD-10-CM | POA: Diagnosis not present

## 2021-06-06 ENCOUNTER — Ambulatory Visit (INDEPENDENT_AMBULATORY_CARE_PROVIDER_SITE_OTHER): Payer: Medicare Other | Admitting: Ophthalmology

## 2021-06-06 ENCOUNTER — Other Ambulatory Visit: Payer: Self-pay

## 2021-06-06 DIAGNOSIS — H35372 Puckering of macula, left eye: Secondary | ICD-10-CM

## 2021-06-06 DIAGNOSIS — H353221 Exudative age-related macular degeneration, left eye, with active choroidal neovascularization: Secondary | ICD-10-CM

## 2021-06-06 DIAGNOSIS — H353211 Exudative age-related macular degeneration, right eye, with active choroidal neovascularization: Secondary | ICD-10-CM

## 2021-06-06 MED ORDER — BEVACIZUMAB 2.5 MG/0.1ML IZ SOSY
2.5000 mg | PREFILLED_SYRINGE | INTRAVITREAL | Status: AC | PRN
  Administered 2021-06-06: 2.5 mg via INTRAVITREAL

## 2021-06-06 NOTE — Progress Notes (Signed)
06/06/2021     CHIEF COMPLAINT Patient presents for  Chief Complaint  Patient presents with   Macular Degeneration      HISTORY OF PRESENT ILLNESS: Dana Randall is a 71 y.o. female who presents to the clinic today for:   HPI   5-week follow-up post injection for wet AMD OD, monocular patient with no interval change in symptoms no interval change in medical History  Last edited by Hurman Horn, MD on 06/06/2021  8:15 AM.      Referring physician: Lavone Orn, MD [address] [place],   [address]  HISTORICAL INFORMATION:   Selected notes from the MEDICAL RECORD NUMBER       CURRENT MEDICATIONS: Current Outpatient Medications (Ophthalmic Drugs)  Medication Sig   prednisoLONE acetate (PRED FORTE) 1 % ophthalmic suspension Place 1 drop into the right eye 2 (two) times daily.   No current facility-administered medications for this visit. (Ophthalmic Drugs)   Current Outpatient Medications (Other)  Medication Sig   aspirin 81 MG chewable tablet Chew 81 mg by mouth daily.   atorvastatin (LIPITOR) 20 MG tablet Take 20 mg by mouth daily.   meclizine (ANTIVERT) 25 MG tablet Take by mouth.   Multiple Vitamins-Minerals (ICAPS AREDS 2 PO) Take 1 mg by mouth daily.   Omega-3 Fatty Acids (FISH OIL) 1000 MG CAPS Take 1,000 mg by mouth daily.   omeprazole (PRILOSEC) 20 MG capsule Take 20 mg by mouth daily.   valACYclovir (VALTREX) 1000 MG tablet Take 1,000 mg by mouth 3 (three) times daily.   No current facility-administered medications for this visit. (Other)      REVIEW OF SYSTEMS: ROS   Negative for: Constitutional, Gastrointestinal, Neurological, Skin, Genitourinary, Musculoskeletal, HENT, Endocrine, Cardiovascular, Eyes, Respiratory, Psychiatric, Allergic/Imm, Heme/Lymph Last edited by Hurman Horn, MD on 06/06/2021  8:15 AM.       ALLERGIES Allergies  Allergen Reactions   Codeine Rash    PAST MEDICAL HISTORY No past medical history  on file. Past Surgical History:  Procedure Laterality Date   CATARACT EXTRACTION Right 2021   Dr. Prudencio Burly   CATARACT EXTRACTION Left 2020   Dr. Prudencio Burly    FAMILY HISTORY No family history on file.  SOCIAL HISTORY Social History   Tobacco Use   Smoking status: Never   Smokeless tobacco: Never  Substance Use Topics   Alcohol use: No   Drug use: No         OPHTHALMIC EXAM:  Base Eye Exam     Visual Acuity (ETDRS)       Right Left   Dist [place] 20/20 CF at 3'         Tonometry (Tonopen, 8:14 AM)       Right Left   Pressure 9 12         Pupils       Pupils APD   Right PERRL None   Left PERRL None         Visual Fields       Left Right     Full   Restrictions Partial inner superior temporal, inferior temporal, superior nasal, inferior nasal deficiencies          Neuro/Psych     Oriented x3: Yes   Mood/Affect: Normal         Dilation     Right eye: 1.0% Mydriacyl, 2.5% Phenylephrine @ 8:14 AM           Slit Lamp and  Fundus Exam     External Exam       Right Left   External Normal Normal         Slit Lamp Exam       Right Left   Lids/Lashes Normal Normal   Conjunctiva/Sclera White and quiet White and quiet   Cornea Clear Clear   Anterior Chamber Deep and quiet Deep and quiet   Iris Round and reactive Round and reactive   Lens Posterior chamber intraocular lens Posterior chamber intraocular lens, 3+ Posterior capsular opacification   Anterior Vitreous Normal Normal         Fundus Exam       Right Left   Posterior Vitreous Normal Normal   Disc Peripapillary atrophy Peripapillary atrophy   C/D Ratio 0.65 0.7   Macula Atrophy, Early age related macular degeneration, Retinal pigment epithelial atrophy, Mottling, Retinal pigment epithelial mottling, no macular thickening, no exudates, no hemorrhage, Pigmented atrophy, mild, Hard drusen Atrophy, Pigmented atrophy subfoveal approximately 12 disc areas in size with no fixation to  small target projection except at edges of normal choriocapillaris, Retinal pigment epithelial atrophy, with region of retinal thickening temporal to the foveal avascular zone. Does not fixate on projected target, except at edges of atrophy, Epiretinal membrane, Foveates on small projected beam   Vessels Normal Normal   Periphery Normal Normal            IMAGING AND PROCEDURES  Imaging and Procedures for 06/06/21  OCT, Retina - OU - Both Eyes       Right Eye Quality was good. Scan locations included subfoveal. Central Foveal Thickness: 285. Progression has been stable. Findings include normal foveal contour, retinal drusen , subretinal scarring, subretinal fluid, intraretinal fluid.   Left Eye Quality was good. Scan locations included subfoveal. Central Foveal Thickness: 470. Progression has been stable. Findings include outer retinal atrophy, central retinal atrophy, disciform scar, abnormal foveal contour, intraretinal fluid, subretinal scarring, cystoid macular edema, epiretinal membrane, vitreous traction.   Notes OD, new region of vascularized PED superior to [facility] January 2023 with subretinal fluid and early intraretinal fluid now vastly improved postinjection #1, Avastin,.  These findings were disclosed and reviewed with the patient signifying improvement of recently new CNVM OD, repeat injection today  OS with large disciform scar severe epiretinal membrane but also now With intraretinal fluid  and CME, which apparently now is clearly from a form from vitreal foveal traction epiretinal Lifting of the inner retina.  No VM traction and no ERM as recent as December 2021.  These findings were reviewed with the patient.   Likely some aspect of no active AMD OS CME stable overall OS,      Intravitreal Injection, Pharmacologic Agent - OD - Right Eye       Time Out 06/06/2021. 8:56 AM. Confirmed correct patient, procedure, site, and patient consented.   Anesthesia Topical anesthesia  was used. Anesthetic medications included Lidocaine 4%.   Procedure Preparation included 5% betadine to ocular surface, 10% betadine to eyelids. A 30 gauge needle was used.   Injection: 2.5 mg bevacizumab 2.5 MG/0.1ML   Route: Intravitreal, Site: Right Eye   NDC: [phone], Lot: [id number]   Post-op Post injection exam found visual acuity of at least counting fingers. The patient tolerated the procedure well. There were no complications. The patient received written and verbal post procedure care education. Post injection medications were not given.              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  These findings were disclosed and reviewed with the patient signifying improvement of recently new CNVM OD, repeat injection today     ICD-10-CM   1. Exudative age-related macular degeneration of right eye with active choroidal neovascularization (HCC)  H35.3211 OCT, Retina - OU - Both Eyes    Intravitreal Injection, Pharmacologic Agent - OD - Right Eye    bevacizumab (AVASTIN) SOSY 2.5 mg    2. Exudative age-related macular degeneration of left eye with active choroidal neovascularization (HCC)  H35.3221 OCT, Retina - OU - Both Eyes    3. Macular pucker, left eye  H35.372 OCT, Retina - OU - Both Eyes      1.  OD, vastly improved post injection # 1 of Avastin.  Vascularized PED with serous retinal detachment nearly resolved post 1 injection, will repeat injection today and maintain intense 5 to 6-week interval examination for the next 2 years order to prevent this monocular patient  recurrence issues  2.  OS we will continue to monitor, no  active CNVM by OCT today.  3.  OS with severe epiretinal membrane yet with outer retinal scarring not likely to improve acuity with surgical intervention  Ophthalmic Meds Ordered this visit:  Meds ordered this encounter  Medications   bevacizumab (AVASTIN) SOSY 2.5 mg       Return in about 5 weeks (around 07/11/2021) for dilate, OD, AVASTIN OCT.  There are no Patient Instructions on file for this visit.   Explained the diagnoses, plan, and follow up with the patient and they expressed understanding.  Patient expressed understanding of the importance of proper follow up care.   Clent Demark Timberlynn Kizziah M.D. Diseases & Surgery of the Retina and Vitreous Retina & Diabetic [place] 06/06/21     Abbreviations: M myopia (nearsighted); A astigmatism; H hyperopia (farsighted); P presbyopia; Mrx spectacle prescription;  CTL contact lenses; OD right eye; OS left eye; OU both eyes  XT exotropia; ET esotropia; PEK punctate epithelial keratitis; PEE punctate epithelial erosions; DES dry eye syndrome; MGD meibomian gland dysfunction; ATs artificial tears; PFAT's preservative free artificial tears; [place] nuclear sclerotic cataract; PSC posterior subcapsular cataract; ERM epi-retinal membrane; PVD posterior vitreous detachment; RD retinal detachment; DM diabetes mellitus; DR diabetic retinopathy; NPDR non-proliferative diabetic retinopathy; PDR proliferative diabetic retinopathy; CSME clinically significant macular edema; DME diabetic macular edema; dbh dot blot hemorrhages; CWS cotton wool spot; POAG primary open angle glaucoma; C/D cup-to-disc ratio; HVF humphrey visual field; GVF goldmann visual field; OCT optical coherence tomography; IOP intraocular pressure; BRVO Branch retinal vein occlusion; CRVO central retinal vein occlusion; CRAO central retinal artery occlusion; BRAO branch retinal artery occlusion; RT retinal tear; SB scleral buckle; PPV pars plana vitrectomy; VH Vitreous hemorrhage; PRP panretinal  laser photocoagulation; IVK intravitreal kenalog; VMT vitreomacular traction; MH Macular hole;  NVD neovascularization of the disc; NVE neovascularization elsewhere; AREDS age related eye disease study; ARMD age related macular degeneration; POAG primary open angle glaucoma; EBMD epithelial/anterior basement membrane dystrophy; ACIOL anterior chamber intraocular lens; IOL intraocular lens; PCIOL posterior chamber intraocular lens; Phaco/IOL phacoemulsification with intraocular lens placement; [place] photorefractive keratectomy; LASIK laser assisted in situ keratomileusis; HTN hypertension; DM diabetes mellitus; COPD chronic obstructive pulmonary disease

## 2021-06-06 NOTE — Assessment & Plan Note (Addendum)
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  These findings were disclosed and reviewed with the patient signifying improvement of recently new CNVM OD, repeat injection today

## 2021-06-20 DIAGNOSIS — H401411 Capsular glaucoma with pseudoexfoliation of lens, right eye, mild stage: Secondary | ICD-10-CM | POA: Diagnosis not present

## 2021-06-20 DIAGNOSIS — Z961 Presence of intraocular lens: Secondary | ICD-10-CM | POA: Diagnosis not present

## 2021-06-20 DIAGNOSIS — H26492 Other secondary cataract, left eye: Secondary | ICD-10-CM | POA: Diagnosis not present

## 2021-07-10 DIAGNOSIS — L570 Actinic keratosis: Secondary | ICD-10-CM | POA: Diagnosis not present

## 2021-07-10 DIAGNOSIS — X32XXXD Exposure to sunlight, subsequent encounter: Secondary | ICD-10-CM | POA: Diagnosis not present

## 2021-07-10 DIAGNOSIS — Z08 Encounter for follow-up examination after completed treatment for malignant neoplasm: Secondary | ICD-10-CM | POA: Diagnosis not present

## 2021-07-10 DIAGNOSIS — Z85828 Personal history of other malignant neoplasm of skin: Secondary | ICD-10-CM | POA: Diagnosis not present

## 2021-07-11 ENCOUNTER — Ambulatory Visit (INDEPENDENT_AMBULATORY_CARE_PROVIDER_SITE_OTHER): Payer: Medicare Other | Admitting: Ophthalmology

## 2021-07-11 ENCOUNTER — Encounter (INDEPENDENT_AMBULATORY_CARE_PROVIDER_SITE_OTHER): Payer: Self-pay | Admitting: Ophthalmology

## 2021-07-11 ENCOUNTER — Other Ambulatory Visit: Payer: Self-pay

## 2021-07-11 DIAGNOSIS — H353211 Exudative age-related macular degeneration, right eye, with active choroidal neovascularization: Secondary | ICD-10-CM | POA: Diagnosis not present

## 2021-07-11 DIAGNOSIS — H35372 Puckering of macula, left eye: Secondary | ICD-10-CM

## 2021-07-11 DIAGNOSIS — H353221 Exudative age-related macular degeneration, left eye, with active choroidal neovascularization: Secondary | ICD-10-CM | POA: Diagnosis not present

## 2021-07-11 MED ORDER — BEVACIZUMAB 2.5 MG/0.1ML IZ SOSY
2.5000 mg | PREFILLED_SYRINGE | INTRAVITREAL | Status: AC | PRN
  Administered 2021-07-11: 2.5 mg via INTRAVITREAL

## 2021-07-11 NOTE — Assessment & Plan Note (Signed)
Severe but not impactful on acuity OS ?

## 2021-07-11 NOTE — Assessment & Plan Note (Signed)
Now post Avastin No. 2, repeat injection today, to maintain resolution of large serous retinal detachment superior to FAZ.  Lengthy discussion regarding we will be slow to increase the or extend the interval examination due to monocular status in order to protect acuity and suppress this lesion regrowth ?

## 2021-07-11 NOTE — Progress Notes (Signed)
? ? ?07/11/2021 ? ?  ? ?CHIEF COMPLAINT ?Patient presents for  ?Chief Complaint  ?Patient presents with  ? Macular Degeneration  ? ? ? ? ?HISTORY OF PRESENT ILLNESS: ?Dana Randall is a 71 y.o. female who presents to the clinic today for:  ? ?HPI   ?Vision OD has remained stable, currently today at 5-week follow-up post Avastin injection OD for recent onset CNVM and monocular patient. ? ?No change in medical history no change in medications ?Last edited by Hurman Horn, MD on 07/11/2021  8:27 AM.  ?  ? ? ?Referring physician: ?Lavone Orn, MD ?[place] ?[address] ?[place],  [place] [address] ? ?HISTORICAL INFORMATION:  ? ?Selected notes from the [place] ?  ?   ? ?CURRENT MEDICATIONS: ?Current Outpatient Medications (Ophthalmic Drugs)  ?Medication Sig  ? prednisoLONE acetate (PRED FORTE) 1 % ophthalmic suspension Place 1 drop into the right eye 2 (two) times daily.  ? ?No current facility-administered medications for this visit. (Ophthalmic Drugs)  ? ?Current Outpatient Medications (Other)  ?Medication Sig  ? aspirin 81 MG chewable tablet Chew 81 mg by mouth daily.  ? atorvastatin (LIPITOR) 20 MG tablet Take 20 mg by mouth daily.  ? meclizine (ANTIVERT) 25 MG tablet Take by mouth.  ? Multiple Vitamins-Minerals (ICAPS AREDS 2 PO) Take 1 mg by mouth daily.  ? Omega-3 Fatty Acids (FISH OIL) 1000 MG CAPS Take 1,000 mg by mouth daily.  ? omeprazole (PRILOSEC) 20 MG capsule Take 20 mg by mouth daily.  ? valACYclovir (VALTREX) 1000 MG tablet Take 1,000 mg by mouth 3 (three) times daily.  ? ?No current facility-administered medications for this visit. (Other)  ? ? ? ? ?REVIEW OF SYSTEMS: ?ROS   ?Negative for: Constitutional, Gastrointestinal, Neurological, Skin, Genitourinary, Musculoskeletal, HENT, Endocrine, Cardiovascular, Eyes, Respiratory, Psychiatric, Allergic/Imm, Heme/Lymph ?Last edited by Hurman Horn, MD on 07/11/2021  8:24 AM.  ?  ? ? ? ?ALLERGIES ?Allergies  ?Allergen Reactions  ? Codeine  Rash  ? ? ?PAST MEDICAL HISTORY ?History reviewed. No pertinent past medical history. ?Past Surgical History:  ?Procedure Laterality Date  ? CATARACT EXTRACTION Right 2021  ? Dr. Prudencio Burly  ? CATARACT EXTRACTION Left 2020  ? Dr. Prudencio Burly  ? ? ?FAMILY HISTORY ?History reviewed. No pertinent family history. ? ?SOCIAL HISTORY ?Social History  ? ?Tobacco Use  ? Smoking status: Never  ? Smokeless tobacco: Never  ?Substance Use Topics  ? Alcohol use: No  ? Drug use: No  ? ?  ? ?  ? ?OPHTHALMIC EXAM: ? ?Base Eye Exam   ? ? Visual Acuity (ETDRS)   ? ?   Right Left  ? Dist [place] 20/25 +2   ? ?  ?  ? ? Tonometry (Tonopen, 8:27 AM)   ? ?   Right Left  ? Pressure 9 11  ? ?  ?  ? ? Pupils   ? ?   Pupils  ? Right PERRL  ? Left PERRL  ? ?  ?  ? ? Visual Fields   ? ?   Left Right  ? Restrictions Partial inner superior temporal, inferior temporal, superior nasal, inferior nasal deficiencies   ? ?  ?  ? ? Extraocular Movement   ? ?   Right Left  ?  Full, Ortho Full, Ortho  ? ?  ?  ? ? Neuro/Psych   ? ? Oriented x3: Yes  ? Mood/Affect: Normal  ? ?  ?  ? ?  ? ?  Slit Lamp and Fundus Exam   ? ? External Exam   ? ?   Right Left  ? External Normal Normal  ? ?  ?  ? ? Slit Lamp Exam   ? ?   Right Left  ? Lids/Lashes Normal Normal  ? Conjunctiva/Sclera White and quiet White and quiet  ? Cornea Clear Clear  ? Anterior Chamber Deep and quiet Deep and quiet  ? Iris Round and reactive Round and reactive  ? Lens Posterior chamber intraocular lens Posterior chamber intraocular lens, 3+ Posterior capsular opacification  ? Anterior Vitreous Normal Normal  ? ?  ?  ? ? Fundus Exam   ? ?   Right Left  ? Posterior Vitreous Normal   ? Disc Peripapillary atrophy   ? C/D Ratio 0.65   ? Macula Atrophy, Early age related macular degeneration, Retinal pigment epithelial atrophy, Mottling, Retinal pigment epithelial mottling, no macular thickening, no exudates, no hemorrhage, Pigmented atrophy, mild, Hard drusen   ? Vessels Normal   ? Periphery Normal   ? ?  ?  ? ?   ? ? ?IMAGING AND PROCEDURES  ?Imaging and Procedures for 07/11/21 ? ?OCT, Retina - OU - Both Eyes   ? ?   ?Right Eye ?Quality was good. Scan locations included subfoveal. Central Foveal Thickness: 279. Progression has been stable. Findings include normal foveal contour, retinal drusen , subretinal scarring, subretinal fluid, intraretinal fluid.  ? ?Left Eye ?Quality was good. Scan locations included subfoveal. Central Foveal Thickness: 460. Progression has been stable. Findings include outer retinal atrophy, central retinal atrophy, disciform scar, abnormal foveal contour, intraretinal fluid, subretinal scarring, cystoid macular edema, epiretinal membrane, vitreous traction.  ? ?Notes ?OD, recent region of vascularized PED superior to [facility] January 2023 with subretinal fluid and early intraretinal fluid now vastly improved postinjection #1 and #2, Avastin,.  These findings were disclosed and reviewed with the patient signifying improvement of recently new CNVM OD, repeat injection today, and this monocular patient to maintain the improvement ? ?OS with large disciform scar severe epiretinal membrane but also now With intraretinal fluid  and CME, which apparently now is clearly from a form from vitreal foveal traction epiretinal Lifting of the inner retina.  No VM traction and no ERM as recent as December 2021.  These findings were reviewed with the patient.   Likely some aspect of no active AMD OS CME stable overall OS,  ? ?  ? ?Intravitreal Injection, Pharmacologic Agent - OD - Right Eye   ? ?   ?Time Out ?07/11/2021. 9:06 AM. Confirmed correct patient, procedure, site, and patient consented.  ? ?Anesthesia ?Topical anesthesia was used. Anesthetic medications included Lidocaine 4%.  ? ?Procedure ?Preparation included 5% betadine to ocular surface, 10% betadine to eyelids. A 30 gauge needle was used.  ? ?Injection: ?2.5 mg bevacizumab 2.5 MG/0.1ML ?  Route: Intravitreal, Site: Right Eye ?  Gray: [id number], Lot:  [id number]  ? ?Post-op ?Post injection exam found visual acuity of at least counting fingers. The patient tolerated the procedure well. There were no complications. The patient received written and verbal post procedure care education. Post injection medications were not given.  ? ?  ? ? ?  ?  ? ?  ?ASSESSMENT/PLAN: ? ?Exudative age-related macular degeneration of right eye with active choroidal neovascularization ([place]) ?Now post Avastin No. 2, repeat injection today, to maintain resolution of large serous retinal detachment superior to FAZ.  Lengthy discussion regarding we will be slow to increase the or  extend the interval examination due to monocular status in order to protect acuity and suppress this lesion regrowth ? ?Macular pucker, left eye ?Severe but not impactful on acuity OS ? ?Exudative age-related macular degeneration of left eye with active choroidal neovascularization ([place]) ?Inactive disciform  ? ?  ICD-10-CM   ?1. Exudative age-related macular degeneration of right eye with active choroidal neovascularization (HCC)  H35.3211 OCT, Retina - OU - Both Eyes  ?  Intravitreal Injection, Pharmacologic Agent - OD - Right Eye  ?  bevacizumab (AVASTIN) SOSY 2.5 mg  ?  ?2. Exudative age-related macular degeneration of left eye with active choroidal neovascularization (HCC)  H35.3221 OCT, Retina - OU - Both Eyes  ?  ?3. Macular pucker, left eye  H35.372   ?  ? ? ?1.  OS stable over time ? ?2.  OD, much improved post injections of Avastin No. 1 and 2, will repeat injection today to maintain suppression of lesion ? ?3. ? ?Ophthalmic Meds Ordered this visit:  ?Meds ordered this encounter  ?Medications  ? bevacizumab (AVASTIN) SOSY 2.5 mg  ? ? ?  ? ?Return in about 5 weeks (around 08/15/2021) for dilate, OD, AVASTIN OCT. ? ?There are no Patient Instructions on file for this visit. ? ? ?Explained the diagnoses, plan, and follow up with the patient and they expressed understanding.  Patient expressed understanding of the  importance of proper follow up care.  ? ?Clent Demark. Dilia Alemany M.D. ?Diseases & Surgery of the Retina and Vitreous ?[place] ?07/11/21 ? ? ? ? ?Abbreviations: ?M myopia (nearsighted); A astigmatism;

## 2021-07-11 NOTE — Assessment & Plan Note (Signed)
Inactive disciform ?

## 2021-07-30 ENCOUNTER — Other Ambulatory Visit: Payer: Self-pay | Admitting: Internal Medicine

## 2021-07-30 DIAGNOSIS — Z1231 Encounter for screening mammogram for malignant neoplasm of breast: Secondary | ICD-10-CM

## 2021-08-01 ENCOUNTER — Ambulatory Visit
Admission: RE | Admit: 2021-08-01 | Discharge: 2021-08-01 | Disposition: A | Discharge status: Home / Self Care | Payer: Medicare Other | Source: Ambulatory Visit | Attending: Internal Medicine | Admitting: Internal Medicine

## 2021-08-01 DIAGNOSIS — Z1231 Encounter for screening mammogram for malignant neoplasm of breast: Secondary | ICD-10-CM | POA: Diagnosis not present

## 2021-08-07 ENCOUNTER — Other Ambulatory Visit: Payer: Self-pay | Admitting: Internal Medicine

## 2021-08-07 ENCOUNTER — Ambulatory Visit
Admission: RE | Admit: 2021-08-07 | Discharge: 2021-08-07 | Disposition: A | Discharge status: Home / Self Care | Payer: Medicare Other | Source: Ambulatory Visit | Attending: Internal Medicine | Admitting: Internal Medicine

## 2021-08-07 DIAGNOSIS — M25561 Pain in right knee: Secondary | ICD-10-CM | POA: Diagnosis not present

## 2021-08-07 DIAGNOSIS — G8929 Other chronic pain: Secondary | ICD-10-CM | POA: Diagnosis not present

## 2021-08-07 DIAGNOSIS — M79661 Pain in right lower leg: Secondary | ICD-10-CM | POA: Diagnosis not present

## 2021-08-07 DIAGNOSIS — M79662 Pain in left lower leg: Secondary | ICD-10-CM | POA: Diagnosis not present

## 2021-08-09 DIAGNOSIS — M25562 Pain in left knee: Secondary | ICD-10-CM | POA: Diagnosis not present

## 2021-08-09 DIAGNOSIS — M1711 Unilateral primary osteoarthritis, right knee: Secondary | ICD-10-CM | POA: Diagnosis not present

## 2021-08-13 ENCOUNTER — Ambulatory Visit
Admission: RE | Admit: 2021-08-13 | Discharge: 2021-08-13 | Disposition: A | Discharge status: Home / Self Care | Payer: Medicare Other | Source: Ambulatory Visit | Attending: Sports Medicine | Admitting: Sports Medicine

## 2021-08-13 ENCOUNTER — Other Ambulatory Visit: Payer: Self-pay | Admitting: Sports Medicine

## 2021-08-13 DIAGNOSIS — R52 Pain, unspecified: Secondary | ICD-10-CM

## 2021-08-13 DIAGNOSIS — M25562 Pain in left knee: Secondary | ICD-10-CM | POA: Diagnosis not present

## 2021-08-15 ENCOUNTER — Ambulatory Visit (INDEPENDENT_AMBULATORY_CARE_PROVIDER_SITE_OTHER): Payer: Medicare Other | Admitting: Ophthalmology

## 2021-08-15 ENCOUNTER — Encounter (INDEPENDENT_AMBULATORY_CARE_PROVIDER_SITE_OTHER): Payer: Self-pay | Admitting: Ophthalmology

## 2021-08-15 DIAGNOSIS — H35372 Puckering of macula, left eye: Secondary | ICD-10-CM

## 2021-08-15 DIAGNOSIS — H43822 Vitreomacular adhesion, left eye: Secondary | ICD-10-CM

## 2021-08-15 DIAGNOSIS — H353221 Exudative age-related macular degeneration, left eye, with active choroidal neovascularization: Secondary | ICD-10-CM | POA: Diagnosis not present

## 2021-08-15 DIAGNOSIS — H353211 Exudative age-related macular degeneration, right eye, with active choroidal neovascularization: Secondary | ICD-10-CM | POA: Diagnosis not present

## 2021-08-15 MED ORDER — BEVACIZUMAB 2.5 MG/0.1ML IZ SOSY
2.5000 mg | PREFILLED_SYRINGE | INTRAVITREAL | Status: AC | PRN
  Administered 2021-08-15: 2.5 mg via INTRAVITREAL

## 2021-08-15 NOTE — Progress Notes (Signed)
? ? ?08/15/2021 ? ?  ? ?CHIEF COMPLAINT ?Patient presents for  ?Chief Complaint  ?Patient presents with  ? Macular Degeneration  ? ? ? ? ?HISTORY OF PRESENT ILLNESS: ?Dana Randall is a 71 y.o. female who presents to the clinic today for:  ? ?HPI   ?5 weeks for DILATE, OD AVASTIN OCT. ?Pt stated no changes in vision. ?Pt denies floaters and FOL. ? ? ?Last edited by [name] on 08/15/2021  8:04 AM.  ?  ? ? ?Referring physician: ?Lavone Orn, MD ?[address] ?[address] ?[place],  [place] [address] ? ?HISTORICAL INFORMATION:  ? ?Selected notes from the [facility] ?  ?   ? ?CURRENT MEDICATIONS: ?Current Outpatient Medications (Ophthalmic Drugs)  ?Medication Sig  ? prednisoLONE acetate (PRED FORTE) 1 % ophthalmic suspension Place 1 drop into the right eye 2 (two) times daily.  ? ?No current facility-administered medications for this visit. (Ophthalmic Drugs)  ? ?Current Outpatient Medications (Other)  ?Medication Sig  ? aspirin 81 MG chewable tablet Chew 81 mg by mouth daily.  ? atorvastatin (LIPITOR) 20 MG tablet Take 20 mg by mouth daily.  ? meclizine (ANTIVERT) 25 MG tablet Take by mouth.  ? Multiple Vitamins-Minerals (ICAPS AREDS 2 PO) Take 1 mg by mouth daily.  ? Omega-3 Fatty Acids (FISH OIL) 1000 MG CAPS Take 1,000 mg by mouth daily.  ? omeprazole (PRILOSEC) 20 MG capsule Take 20 mg by mouth daily.  ? valACYclovir (VALTREX) 1000 MG tablet Take 1,000 mg by mouth 3 (three) times daily.  ? ?No current facility-administered medications for this visit. (Other)  ? ? ? ? ?REVIEW OF SYSTEMS: ?ROS   ?Negative for: Constitutional, Gastrointestinal, Neurological, Skin, Genitourinary, Musculoskeletal, HENT, Endocrine, Cardiovascular, Eyes, Respiratory, Psychiatric, Allergic/Imm, Heme/Lymph ?Last edited by [name] on 08/15/2021  8:04 AM.  ?  ? ? ? ?ALLERGIES ?Allergies  ?Allergen Reactions  ? Codeine Rash  ? ? ?PAST MEDICAL HISTORY ?History reviewed. No pertinent past medical history. ?Past Surgical History:   ?Procedure Laterality Date  ? CATARACT EXTRACTION Right 2021  ? Dr. Prudencio Burly  ? CATARACT EXTRACTION Left 2020  ? Dr. Prudencio Burly  ? ? ?FAMILY HISTORY ?Family History  ?Problem Relation Age of Onset  ? Breast cancer Neg Hx   ? ? ?SOCIAL HISTORY ?Social History  ? ?Tobacco Use  ? Smoking status: Never  ? Smokeless tobacco: Never  ?Substance Use Topics  ? Alcohol use: No  ? Drug use: No  ? ?  ? ?  ? ?OPHTHALMIC EXAM: ? ?Base Eye Exam   ? ? Visual Acuity (ETDRS)   ? ?   Right Left  ? Dist [place] 20/20 -1 CF at 2'  ? ?  ?  ? ? Tonometry (Tonopen, 8:09 AM)   ? ?   Right Left  ? Pressure 13 13  ? ?  ?  ? ? Pupils   ? ?   Pupils APD  ? Right PERRL None  ? Left PERRL None  ? ?  ?  ? ? Visual Fields   ? ?   Left Right  ? Restrictions Partial inner superior temporal, inferior temporal, superior nasal, inferior nasal deficiencies   ? ?  ?  ? ? Extraocular Movement   ? ?   Right Left  ?  Full Full  ? ?  ?  ? ? Neuro/Psych   ? ? Oriented x3: Yes  ? Mood/Affect: Normal  ? ?  ?  ? ? Dilation   ? ?  Right eye: 2.5% Phenylephrine, 1.0% Mydriacyl @ 8:09 AM  ? ?  ?  ? ?  ? ?Slit Lamp and Fundus Exam   ? ? External Exam   ? ?   Right Left  ? External Normal Normal  ? ?  ?  ? ? Slit Lamp Exam   ? ?   Right Left  ? Lids/Lashes Normal Normal  ? Conjunctiva/Sclera White and quiet White and quiet  ? Cornea Clear Clear  ? Anterior Chamber Deep and quiet Deep and quiet  ? Iris Round and reactive Round and reactive  ? Lens Posterior chamber intraocular lens Posterior chamber intraocular lens, 3+ Posterior capsular opacification  ? Anterior Vitreous Normal Normal  ? ?  ?  ? ? Fundus Exam   ? ?   Right Left  ? Posterior Vitreous Normal   ? Disc Peripapillary atrophy   ? C/D Ratio 0.65   ? Macula Atrophy, Early age related macular degeneration, Retinal pigment epithelial atrophy, Mottling, Retinal pigment epithelial mottling, no macular thickening, no exudates, no hemorrhage, Pigmented atrophy, mild, Hard drusen   ? Vessels Normal   ? Periphery Normal   ? ?  ?   ? ?  ? ? ?IMAGING AND PROCEDURES  ?Imaging and Procedures for 08/15/21 ? ?OCT, Retina - OU - Both Eyes   ? ?   ?Right Eye ?Quality was good. Scan locations included subfoveal. Central Foveal Thickness: 282. Progression has been stable. Findings include normal foveal contour, retinal drusen , subretinal scarring, subretinal fluid, intraretinal fluid.  ? ?Left Eye ?Quality was good. Scan locations included subfoveal. Central Foveal Thickness: 441. Progression has been stable. Findings include outer retinal atrophy, central retinal atrophy, disciform scar, abnormal foveal contour, intraretinal fluid, subretinal scarring, cystoid macular edema, epiretinal membrane, vitreous traction.  ? ?Notes ?OD, recent region of vascularized PED superior to [facility] January 2023 with subretinal fluid and early intraretinal fluid now vastly improved postinjection #3, Avastin,.  These findings were disclosed and reviewed with the patient signifying improvement of recently new CNVM OD, repeat injection today, and this monocular patient to maintain the improvement ? ?OS with large disciform scar severe epiretinal membrane but also now With intraretinal fluid  and CME, which apparently now is clearly from a form from vitreal foveal traction epiretinal Lifting of the inner retina.  No VM traction and no ERM as recent as December 2021.  These findings were reviewed with the patient.   Likely some aspect of no active AMD OS CME stable overall OS,  ? ?  ? ?Intravitreal Injection, Pharmacologic Agent - OD - Right Eye   ? ?   ?Time Out ?08/15/2021. 8:46 AM. Confirmed correct patient, procedure, site, and patient consented.  ? ?Anesthesia ?Topical anesthesia was used. Anesthetic medications included Lidocaine 4%.  ? ?Procedure ?Preparation included 5% betadine to ocular surface, 10% betadine to eyelids. A 30 gauge needle was used.  ? ?Injection: ?2.5 mg bevacizumab 2.5 MG/0.1ML ?  Route: Intravitreal, Site: Right Eye ?  [place]: [id number], Lot:  [id number]  ? ?Post-op ?Post injection exam found visual acuity of at least counting fingers. The patient tolerated the procedure well. There were no complications. The patient received written and verbal post procedure care education. Post injection medications were not given.  ? ?  ? ? ?  ?  ? ?  ?ASSESSMENT/PLAN: ? ?Vitreomacular traction syndrome, left ?Severe since onset discovery of October 19, 2020, in the region of previous disciform scarring but this is triggered intraretinal fluid and  CME which will diminish the paracentral vision over time. ? ?Surgical intervention would halt this progression OS. ? ?We will discuss with patient regarding vitrectomy membrane peel left eye in order to prevent scotoma enlargement ? ?Exudative age-related macular degeneration of right eye with active choroidal neovascularization ([place]) ?Vastly improved right eye since onset of therapy with Avastin for CNVM vascularized PED superior to FAZ.  Fluid in this region is completely resolved. ? ? ? ?Repeat injection today at current interval of 5 weeks and reevaluate in 5 to 6 weeks.  We will treat it no more than 6-week interval for 1 year right eye and this monocular patient ? ?Macular pucker, left eye ?New onset as of June 2022 accounting for the intraretinal fluid in the macular region and potentially scotoma enlargement, should consider vitrectomy membrane peel left eye to stabilize acuity and prevent enlargement of scotoma ? ?Exudative age-related macular degeneration of left eye with active choroidal neovascularization ([place]) ?Currently inactive OS  ? ?  ICD-10-CM   ?1. Exudative age-related macular degeneration of right eye with active choroidal neovascularization (HCC)  H35.3211 OCT, Retina - OU - Both Eyes  ?  Intravitreal Injection, Pharmacologic Agent - OD - Right Eye  ?  bevacizumab (AVASTIN) SOSY 2.5 mg  ?  ?2. Vitreomacular traction syndrome, left  H43.822   ?  ?3. Macular pucker, left eye  H35.372   ?  ?4. Exudative  age-related macular degeneration of left eye with active choroidal neovascularization ([place])  H35.3221   ?  ? ? ?1.  OD, repeat injection intravitreal Avastin today to maintain much improved macular findings post treatment

## 2021-08-15 NOTE — Assessment & Plan Note (Signed)
Currently inactive OS ?

## 2021-08-15 NOTE — Assessment & Plan Note (Signed)
New onset as of June 2022 accounting for the intraretinal fluid in the macular region and potentially scotoma enlargement, should consider vitrectomy membrane peel left eye to stabilize acuity and prevent enlargement of scotoma ?

## 2021-08-15 NOTE — Assessment & Plan Note (Signed)
Vastly improved right eye since onset of therapy with Avastin for CNVM vascularized PED superior to FAZ.  Fluid in this region is completely resolved. ? ? ? ?Repeat injection today at current interval of 5 weeks and reevaluate in 5 to 6 weeks.  We will treat it no more than 6-week interval for 1 year right eye and this monocular patient ?

## 2021-08-15 NOTE — Assessment & Plan Note (Signed)
Severe since onset discovery of October 19, 2020, in the region of previous disciform scarring but this is triggered intraretinal fluid and CME which will diminish the paracentral vision over time. ? ?Surgical intervention would halt this progression OS. ? ?We will discuss with patient regarding vitrectomy membrane peel left eye in order to prevent scotoma enlargement ?

## 2021-09-06 DIAGNOSIS — R252 Cramp and spasm: Secondary | ICD-10-CM | POA: Diagnosis not present

## 2021-09-06 DIAGNOSIS — M170 Bilateral primary osteoarthritis of knee: Secondary | ICD-10-CM | POA: Diagnosis not present

## 2021-09-27 ENCOUNTER — Encounter (INDEPENDENT_AMBULATORY_CARE_PROVIDER_SITE_OTHER): Payer: Self-pay | Admitting: Ophthalmology

## 2021-09-27 ENCOUNTER — Ambulatory Visit (INDEPENDENT_AMBULATORY_CARE_PROVIDER_SITE_OTHER): Payer: Medicare Other | Admitting: Ophthalmology

## 2021-09-27 DIAGNOSIS — H353211 Exudative age-related macular degeneration, right eye, with active choroidal neovascularization: Secondary | ICD-10-CM | POA: Diagnosis not present

## 2021-09-27 MED ORDER — BEVACIZUMAB 2.5 MG/0.1ML IZ SOSY
2.5000 mg | PREFILLED_SYRINGE | INTRAVITREAL | Status: AC | PRN
  Administered 2021-09-27: 2.5 mg via INTRAVITREAL

## 2021-09-27 NOTE — Assessment & Plan Note (Signed)
Initially improved on Avastin at 5-week interval.  Today at 6-week interval slight recurrence of subretinal fluid superior portion of macula not involving the FAZ.  Repeat injection Avastin today shorten interval next to 5 weeks

## 2021-09-27 NOTE — Progress Notes (Signed)
09/27/2021     CHIEF COMPLAINT Patient presents for  Chief Complaint  Patient presents with   Macular Degeneration      HISTORY OF PRESENT ILLNESS: Dana Randall is a 71 y.o. female who presents to the clinic today for:   HPI   6 weeks for DILATE, OD, AVASTIN, OCT. Pt stated vision has been stable since last visit. Pt denies new floaters and less FOL.  Last edited by [name] on 09/27/2021  8:04 AM.      Referring physician: Katy Apo, MD [place],  [place] [address]  HISTORICAL INFORMATION:   Selected notes from the MEDICAL RECORD NUMBER       CURRENT MEDICATIONS: Current Outpatient Medications (Ophthalmic Drugs)  Medication Sig   prednisoLONE acetate (PRED FORTE) 1 % ophthalmic suspension Place 1 drop into the right eye 2 (two) times daily.   No current facility-administered medications for this visit. (Ophthalmic Drugs)   Current Outpatient Medications (Other)  Medication Sig   aspirin 81 MG chewable tablet Chew 81 mg by mouth daily.   atorvastatin (LIPITOR) 20 MG tablet Take 20 mg by mouth daily.   meclizine (ANTIVERT) 25 MG tablet Take by mouth.   Multiple Vitamins-Minerals (ICAPS AREDS 2 PO) Take 1 mg by mouth daily.   Omega-3 Fatty Acids (FISH OIL) 1000 MG CAPS Take 1,000 mg by mouth daily.   omeprazole (PRILOSEC) 20 MG capsule Take 20 mg by mouth daily.   valACYclovir (VALTREX) 1000 MG tablet Take 1,000 mg by mouth 3 (three) times daily.   No current facility-administered medications for this visit. (Other)      REVIEW OF SYSTEMS: ROS   Negative for: Constitutional, Gastrointestinal, Neurological, Skin, Genitourinary, Musculoskeletal, HENT, Endocrine, Cardiovascular, Eyes, Respiratory, Psychiatric, Allergic/Imm, Heme/Lymph Last edited by [name] on 09/27/2021  8:04 AM.       ALLERGIES Allergies  Allergen Reactions   Codeine Rash    PAST MEDICAL HISTORY No past medical history on file. Past Surgical History:  Procedure  Laterality Date   CATARACT EXTRACTION Right 2021   Dr. Prudencio Burly   CATARACT EXTRACTION Left 2020   Dr. Prudencio Burly    FAMILY HISTORY Family History  Problem Relation Age of Onset   Breast cancer Neg Hx     SOCIAL HISTORY Social History   Tobacco Use   Smoking status: Never   Smokeless tobacco: Never  Substance Use Topics   Alcohol use: No   Drug use: No         OPHTHALMIC EXAM:  Base Eye Exam     Visual Acuity (ETDRS)       Right Left   Dist [place] 20/20 -2 CF at 3'         Tonometry (Tonopen, 8:08 AM)       Right Left   Pressure 12 11         Pupils       Pupils APD   Right PERRL None   Left PERRL None         Visual Fields       Left Right    Full Full         Extraocular Movement       Right Left    Full Full         Neuro/Psych     Oriented x3: Yes   Mood/Affect: Normal         Dilation     Right eye: 2.5% Phenylephrine, 1.0% Mydriacyl @  8:08 AM           Slit Lamp and Fundus Exam     External Exam       Right Left   External Normal Normal         Slit Lamp Exam       Right Left   Lids/Lashes Normal Normal   Conjunctiva/Sclera White and quiet White and quiet   Cornea Clear Clear   Anterior Chamber Deep and quiet Deep and quiet   Iris Round and reactive Round and reactive   Lens Posterior chamber intraocular lens Posterior chamber intraocular lens, 3+ Posterior capsular opacification   Anterior Vitreous Normal Normal         Fundus Exam       Right Left   Posterior Vitreous Normal    Disc Peripapillary atrophy    C/D Ratio 0.65    Macula Atrophy, Early age related macular degeneration, Retinal pigment epithelial atrophy, Mottling, Retinal pigment epithelial mottling, no macular thickening, no exudates, no hemorrhage, Pigmented atrophy, mild, Hard drusen    Vessels Normal    Periphery Normal             IMAGING AND PROCEDURES  Imaging and Procedures for 09/27/21  OCT, Retina - OU - Both Eyes        Right Eye Quality was good. Scan locations included subfoveal. Central Foveal Thickness: 282. Progression has been stable. Findings include normal foveal contour, retinal drusen , intraretinal fluid, subretinal fluid, subretinal scarring.   Left Eye Quality was good. Scan locations included subfoveal. Central Foveal Thickness: 441. Progression has been stable. Findings include abnormal foveal contour, cystoid macular edema, disciform scar, epiretinal membrane, intraretinal fluid, subretinal scarring, vitreous traction, central retinal atrophy, outer retinal atrophy.   Notes OD, recent region of vascularized PED superior to [facility] January 2023 with subretinal fluid and early intraretinal fluid now vastly improved postinjections Avastin, overall yet with new recurrent subretinal fluid superior to FAZ..  These findings were disclosed and reviewed with the patient signifying improvement of recently new CNVM OD, repeat injection today, and this monocular patient to maintain the improvement yet shorten interval to 5 weeks after the current 6-week interval.    OS with large disciform scar severe epiretinal membrane but also now With intraretinal fluid  and CME, which apparently now is clearly from a form from vitreal foveal traction epiretinal Lifting of the inner retina.  No VM traction and no ERM as recent as December 2021.  These findings were reviewed with the patient.   Likely some aspect of no active AMD OS CME stable overall OS,      Intravitreal Injection, Pharmacologic Agent - OD - Right Eye       Time Out 09/27/2021. 8:26 AM. Confirmed correct patient, procedure, site, and patient consented.   Anesthesia Topical anesthesia was used. Anesthetic medications included Lidocaine 4%.   Procedure Preparation included 5% betadine to ocular surface, 10% betadine to eyelids. A 30 gauge needle was used.   Injection: 2.5 mg bevacizumab 2.5 MG/0.1ML   Route: Intravitreal, Site: Right Eye   [place]:  [phone], Lot: [id number], Expiration date: 11/18/2021   Post-op Post injection exam found visual acuity of at least counting fingers. The patient tolerated the procedure well. There were no complications. The patient received written and verbal post procedure care education. Post injection medications were not given.              ASSESSMENT/PLAN:  Exudative age-related macular degeneration of right eye with  active choroidal neovascularization ([place]) Initially improved on Avastin at 5-week interval.  Today at 6-week interval slight recurrence of subretinal fluid superior portion of macula not involving the FAZ.  Repeat injection Avastin today shorten interval next to 5 weeks     ICD-10-CM   1. Exudative age-related macular degeneration of right eye with active choroidal neovascularization (HCC)  H35.3211 OCT, Retina - OU - Both Eyes    Intravitreal Injection, Pharmacologic Agent - OD - Right Eye    bevacizumab (AVASTIN) SOSY 2.5 mg      1.  OD, overall improved wet AMD on therapy since onset of disease discovered January 2023.  At 6-week interval post Avastin today.  We will repeat injection OD today and shorten interval examination to 5 weeks  2.  3.  Ophthalmic Meds Ordered this visit:  Meds ordered this encounter  Medications   bevacizumab (AVASTIN) SOSY 2.5 mg       Return in about 5 weeks (around 11/01/2021) for dilate, OD, AVASTIN OCT.  There are no Patient Instructions on file for this visit.   Explained the diagnoses, plan, and follow up with the patient and they expressed understanding.  Patient expressed understanding of the importance of proper follow up care.   Clent Demark Jehad Bisono M.D. Diseases & Surgery of the Retina and Vitreous [facility] [place] 09/27/21     Abbreviations: M myopia (nearsighted); A astigmatism; H hyperopia (farsighted); P presbyopia; Mrx spectacle prescription;  CTL contact lenses; OD right eye; OS left eye; OU both eyes  XT  exotropia; ET esotropia; PEK punctate epithelial keratitis; PEE punctate epithelial erosions; DES dry eye syndrome; MGD meibomian gland dysfunction; ATs artificial tears; PFAT's preservative free artificial tears; [place] nuclear sclerotic cataract; PSC posterior subcapsular cataract; ERM epi-retinal membrane; PVD posterior vitreous detachment; RD retinal detachment; DM diabetes mellitus; DR diabetic retinopathy; NPDR non-proliferative diabetic retinopathy; PDR proliferative diabetic retinopathy; CSME clinically significant macular edema; DME diabetic macular edema; dbh dot blot hemorrhages; CWS cotton wool spot; POAG primary open angle glaucoma; C/D cup-to-disc ratio; HVF humphrey visual field; GVF goldmann visual field; OCT optical coherence tomography; IOP intraocular pressure; BRVO Branch retinal vein occlusion; CRVO central retinal vein occlusion; CRAO central retinal artery occlusion; BRAO branch retinal artery occlusion; RT retinal tear; SB scleral buckle; PPV pars plana vitrectomy; VH Vitreous hemorrhage; PRP panretinal laser photocoagulation; IVK intravitreal kenalog; VMT vitreomacular traction; MH Macular hole;  NVD neovascularization of the disc; NVE neovascularization elsewhere; AREDS age related eye disease study; ARMD age related macular degeneration; POAG primary open angle glaucoma; EBMD epithelial/anterior basement membrane dystrophy; ACIOL anterior chamber intraocular lens; IOL intraocular lens; PCIOL posterior chamber intraocular lens; Phaco/IOL phacoemulsification with intraocular lens placement; [place] photorefractive keratectomy; LASIK laser assisted in situ keratomileusis; HTN hypertension; DM diabetes mellitus; COPD chronic obstructive pulmonary disease

## 2021-10-04 DIAGNOSIS — E7800 Pure hypercholesterolemia, unspecified: Secondary | ICD-10-CM | POA: Diagnosis not present

## 2021-10-04 DIAGNOSIS — Z0000 Encounter for general adult medical examination without abnormal findings: Secondary | ICD-10-CM | POA: Diagnosis not present

## 2021-10-04 DIAGNOSIS — Z23 Encounter for immunization: Secondary | ICD-10-CM | POA: Diagnosis not present

## 2021-10-04 DIAGNOSIS — M170 Bilateral primary osteoarthritis of knee: Secondary | ICD-10-CM | POA: Diagnosis not present

## 2021-10-04 DIAGNOSIS — Z85820 Personal history of malignant melanoma of skin: Secondary | ICD-10-CM | POA: Diagnosis not present

## 2021-10-04 DIAGNOSIS — Z1331 Encounter for screening for depression: Secondary | ICD-10-CM | POA: Diagnosis not present

## 2021-10-04 DIAGNOSIS — R7301 Impaired fasting glucose: Secondary | ICD-10-CM | POA: Diagnosis not present

## 2021-11-01 ENCOUNTER — Ambulatory Visit (INDEPENDENT_AMBULATORY_CARE_PROVIDER_SITE_OTHER): Payer: Medicare Other | Admitting: Ophthalmology

## 2021-11-01 ENCOUNTER — Encounter (INDEPENDENT_AMBULATORY_CARE_PROVIDER_SITE_OTHER): Payer: Self-pay | Admitting: Ophthalmology

## 2021-11-01 DIAGNOSIS — H353124 Nonexudative age-related macular degeneration, left eye, advanced atrophic with subfoveal involvement: Secondary | ICD-10-CM

## 2021-11-01 DIAGNOSIS — H43822 Vitreomacular adhesion, left eye: Secondary | ICD-10-CM | POA: Diagnosis not present

## 2021-11-01 DIAGNOSIS — H353211 Exudative age-related macular degeneration, right eye, with active choroidal neovascularization: Secondary | ICD-10-CM

## 2021-11-01 DIAGNOSIS — H35372 Puckering of macula, left eye: Secondary | ICD-10-CM

## 2021-11-01 MED ORDER — BEVACIZUMAB 2.5 MG/0.1ML IZ SOSY
2.5000 mg | PREFILLED_SYRINGE | INTRAVITREAL | Status: AC | PRN
  Administered 2021-11-01: 2.5 mg via INTRAVITREAL

## 2021-11-01 NOTE — Assessment & Plan Note (Signed)
Much less subretinal fluid around vascularized PED at shorter interval 5-week interval.  Repeat injection today after recent exacerbation June 2023 and this monocular patient

## 2021-11-01 NOTE — Assessment & Plan Note (Signed)
Centrally accounts for scotoma

## 2021-11-01 NOTE — Progress Notes (Signed)
11/01/2021     CHIEF COMPLAINT Patient presents for  Chief Complaint  Patient presents with   Macular Degeneration      HISTORY OF PRESENT ILLNESS: Dana Randall is a 71 y.o. female who presents to the clinic today for:   HPI   5 weeks for DILATE OD, AVASTIN, OCT. Pt stated vision has been stable since last visit.  Last edited by [name] on 11/01/2021  8:04 AM.      Referring physician: Lavone Orn, MD [place]. [address] [place],  [place] [address]  HISTORICAL INFORMATION:   Selected notes from the MEDICAL RECORD NUMBER       CURRENT MEDICATIONS: Current Outpatient Medications (Ophthalmic Drugs)  Medication Sig   prednisoLONE acetate (PRED FORTE) 1 % ophthalmic suspension Place 1 drop into the right eye 2 (two) times daily.   No current facility-administered medications for this visit. (Ophthalmic Drugs)   Current Outpatient Medications (Other)  Medication Sig   aspirin 81 MG chewable tablet Chew 81 mg by mouth daily.   atorvastatin (LIPITOR) 20 MG tablet Take 20 mg by mouth daily.   meclizine (ANTIVERT) 25 MG tablet Take by mouth.   Multiple Vitamins-Minerals (ICAPS AREDS 2 PO) Take 1 mg by mouth daily.   Omega-3 Fatty Acids (FISH OIL) 1000 MG CAPS Take 1,000 mg by mouth daily.   omeprazole (PRILOSEC) 20 MG capsule Take 20 mg by mouth daily.   valACYclovir (VALTREX) 1000 MG tablet Take 1,000 mg by mouth 3 (three) times daily.   No current facility-administered medications for this visit. (Other)      REVIEW OF SYSTEMS: ROS   Negative for: Constitutional, Gastrointestinal, Neurological, Skin, Genitourinary, Musculoskeletal, HENT, Endocrine, Cardiovascular, Eyes, Respiratory, Psychiatric, Allergic/Imm, Heme/Lymph Last edited by [name] on 11/01/2021  8:04 AM.       ALLERGIES Allergies  Allergen Reactions   Codeine Rash    PAST MEDICAL HISTORY History reviewed. No pertinent past medical history. Past Surgical History:  Procedure  Laterality Date   CATARACT EXTRACTION Right 2021   Dr. Prudencio Burly   CATARACT EXTRACTION Left 2020   Dr. Prudencio Burly    FAMILY HISTORY Family History  Problem Relation Age of Onset   Breast cancer Neg Hx     SOCIAL HISTORY Social History   Tobacco Use   Smoking status: Never   Smokeless tobacco: Never  Substance Use Topics   Alcohol use: No   Drug use: No         OPHTHALMIC EXAM:  Base Eye Exam     Visual Acuity (ETDRS)       Right Left   Dist [place] 20/20 -2 CF at 2'         Tonometry (Tonopen, 8:09 AM)       Right Left   Pressure 18 15         Pupils       Pupils APD   Right PERRL None   Left PERRL None         Visual Fields       Left Right     Full   Restrictions Partial inner superior temporal, inferior temporal, superior nasal, inferior nasal deficiencies          Extraocular Movement       Right Left    Full Full         Neuro/Psych     Oriented x3: Yes   Mood/Affect: Normal  Dilation     Right eye: 2.5% Phenylephrine, 1.0% Mydriacyl @ 8:09 AM           Slit Lamp and Fundus Exam     External Exam       Right Left   External Normal Normal         Slit Lamp Exam       Right Left   Lids/Lashes Normal Normal   Conjunctiva/Sclera White and quiet White and quiet   Cornea Clear Clear   Anterior Chamber Deep and quiet Deep and quiet   Iris Round and reactive Round and reactive   Lens Posterior chamber intraocular lens Posterior chamber intraocular lens, 3+ Posterior capsular opacification   Anterior Vitreous Normal Normal         Fundus Exam       Right Left   Posterior Vitreous Normal    Disc Peripapillary atrophy    C/D Ratio 0.65    Macula Atrophy, Early age related macular degeneration, Retinal pigment epithelial atrophy, Mottling, Retinal pigment epithelial mottling, no macular thickening, no exudates, no hemorrhage, Pigmented atrophy, mild, Hard drusen    Vessels Normal    Periphery Normal              IMAGING AND PROCEDURES  Imaging and Procedures for 11/01/21  OCT, Retina - OU - Both Eyes       Right Eye Quality was good. Scan locations included subfoveal. Central Foveal Thickness: 279. Progression has improved. Findings include normal foveal contour, retinal drusen , intraretinal fluid, subretinal fluid, subretinal scarring.   Left Eye Quality was good. Scan locations included subfoveal. Central Foveal Thickness: 441. Progression has been stable. Findings include abnormal foveal contour, cystoid macular edema, disciform scar, epiretinal membrane, intraretinal fluid, subretinal scarring, vitreous traction, central retinal atrophy, outer retinal atrophy.   Notes OD, recent region of vascularized PED superior to [facility] January 2023 with subretinal fluid and early intraretinal fluid now vastly improved postinjections Avastin, overall yet with new recurrent subretinal fluid superior to FAZ..  These findings were disclosed and reviewed with the patient signifying improvement of recently new CNVM OD, repeat injection today, and this monocular patient to maintain the improvement yet shorten interval to 5 weeks   OS with large disciform scar severe epiretinal membrane but also now With intraretinal fluid  and CME, which apparently now is clearly from a form from vitreal foveal traction epiretinal Lifting of the inner retina.  No VM traction and no ERM as recent as December 2021.  These findings were reviewed with the patient.   Likely some aspect of no active AMD OS CME stable overall OS,       Intravitreal Injection, Pharmacologic Agent - OD - Right Eye       Time Out 11/01/2021. 8:33 AM. Confirmed correct patient, procedure, site, and patient consented.   Anesthesia Topical anesthesia was used. Anesthetic medications included Lidocaine 4%.   Procedure Preparation included 5% betadine to ocular surface, 10% betadine to eyelids. A 30 gauge needle was used.   Injection: 2.5 mg  bevacizumab 2.5 MG/0.1ML   Route: Intravitreal, Site: Right Eye   NDC: [phone], Lot: [id number], Expiration date: 12/26/2021   Post-op Post injection exam found visual acuity of at least counting fingers. The patient tolerated the procedure well. There were no complications. The patient received written and verbal post procedure care education. Post injection medications were not given.              ASSESSMENT/PLAN:  Exudative age-related  macular degeneration of right eye with active choroidal neovascularization ([place]) Much less subretinal fluid around vascularized PED at shorter interval 5-week interval.  Repeat injection today after recent exacerbation June 2023 and this monocular patient  Vitreomacular traction syndrome, left Severe and worsening with outer retinal and intraretinal fluid as a consequence of anterior posterior vitreal macular traction.  Discussed potential vitrectomy membrane peel left eye to stabilize and prevent enlargement of scotoma  Macular pucker, left eye Severe and worsening with outer retinal and intraretinal fluid as a consequence of anterior posterior vitreal macular traction.  Discussed potential vitrectomy membrane peel left eye to stabilize and prevent enlargement of scotoma  Advanced nonexudative age-related macular degeneration of left eye with subfoveal involvement Centrally accounts for scotoma     ICD-10-CM   1. Exudative age-related macular degeneration of right eye with active choroidal neovascularization (HCC)  H35.3211 OCT, Retina - OU - Both Eyes    Intravitreal Injection, Pharmacologic Agent - OD - Right Eye    bevacizumab (AVASTIN) SOSY 2.5 mg    2. Vitreomacular traction syndrome, left  H43.822     3. Macular pucker, left eye  H35.372     4. Advanced nonexudative age-related macular degeneration of left eye with subfoveal involvement  H35.3124       1.  OD improved macular findings at shorter interval follow-up of 5 weeks.  Repeat  injection Avastin today to maintain visual acuity in this monocular patient  2.  Follow-up again in 5 weeks.  3.  OS discussed vitreal macular traction syndrome with epiretinal membrane removal so as to prevent ongoing atrophic change from persistent intraretinal fluid.  We will discuss upon next visit  Ophthalmic Meds Ordered this visit:  Meds ordered this encounter  Medications   bevacizumab (AVASTIN) SOSY 2.5 mg       Return in about 1 month (around 12/04/2021) for dilate, OD, AVASTIN OCT,, specifically 33 days.  There are no Patient Instructions on file for this visit.   Explained the diagnoses, plan, and follow up with the patient and they expressed understanding.  Patient expressed understanding of the importance of proper follow up care.   Clent Demark Sewell Pitner M.D. Diseases & Surgery of the Retina and Vitreous Retina & Diabetic [place] 11/01/21     Abbreviations: M myopia (nearsighted); A astigmatism; H hyperopia (farsighted); P presbyopia; Mrx spectacle prescription;  CTL contact lenses; OD right eye; OS left eye; OU both eyes  XT exotropia; ET esotropia; PEK punctate epithelial keratitis; PEE punctate epithelial erosions; DES dry eye syndrome; MGD meibomian gland dysfunction; ATs artificial tears; PFAT's preservative free artificial tears; [place] nuclear sclerotic cataract; PSC posterior subcapsular cataract; ERM epi-retinal membrane; PVD posterior vitreous detachment; RD retinal detachment; DM diabetes mellitus; DR diabetic retinopathy; NPDR non-proliferative diabetic retinopathy; PDR proliferative diabetic retinopathy; CSME clinically significant macular edema; DME diabetic macular edema; dbh dot blot hemorrhages; CWS cotton wool spot; POAG primary open angle glaucoma; C/D cup-to-disc ratio; HVF humphrey visual field; GVF goldmann visual field; OCT optical coherence tomography; IOP intraocular pressure; BRVO Branch retinal vein occlusion; CRVO central retinal vein occlusion; CRAO  central retinal artery occlusion; BRAO branch retinal artery occlusion; RT retinal tear; SB scleral buckle; PPV pars plana vitrectomy; VH Vitreous hemorrhage; PRP panretinal laser photocoagulation; IVK intravitreal kenalog; VMT vitreomacular traction; MH Macular hole;  NVD neovascularization of the disc; NVE neovascularization elsewhere; AREDS age related eye disease study; ARMD age related macular degeneration; POAG primary open angle glaucoma; EBMD epithelial/anterior basement membrane dystrophy; ACIOL anterior chamber intraocular  lens; IOL intraocular lens; PCIOL posterior chamber intraocular lens; Phaco/IOL phacoemulsification with intraocular lens placement; [place] photorefractive keratectomy; LASIK laser assisted in situ keratomileusis; HTN hypertension; DM diabetes mellitus; COPD chronic obstructive pulmonary disease

## 2021-11-01 NOTE — Assessment & Plan Note (Signed)
Severe and worsening with outer retinal and intraretinal fluid as a consequence of anterior posterior vitreal macular traction.  Discussed potential vitrectomy membrane peel left eye to stabilize and prevent enlargement of scotoma

## 2021-11-15 DIAGNOSIS — E7800 Pure hypercholesterolemia, unspecified: Secondary | ICD-10-CM | POA: Diagnosis not present

## 2021-11-27 DIAGNOSIS — L821 Other seborrheic keratosis: Secondary | ICD-10-CM | POA: Diagnosis not present

## 2021-11-27 DIAGNOSIS — X32XXXD Exposure to sunlight, subsequent encounter: Secondary | ICD-10-CM | POA: Diagnosis not present

## 2021-11-27 DIAGNOSIS — L570 Actinic keratosis: Secondary | ICD-10-CM | POA: Diagnosis not present

## 2021-11-27 DIAGNOSIS — Z85828 Personal history of other malignant neoplasm of skin: Secondary | ICD-10-CM | POA: Diagnosis not present

## 2021-11-27 DIAGNOSIS — Z08 Encounter for follow-up examination after completed treatment for malignant neoplasm: Secondary | ICD-10-CM | POA: Diagnosis not present

## 2021-11-27 DIAGNOSIS — Z1283 Encounter for screening for malignant neoplasm of skin: Secondary | ICD-10-CM | POA: Diagnosis not present

## 2021-12-04 ENCOUNTER — Ambulatory Visit (INDEPENDENT_AMBULATORY_CARE_PROVIDER_SITE_OTHER): Payer: Medicare Other | Admitting: Ophthalmology

## 2021-12-04 ENCOUNTER — Encounter (INDEPENDENT_AMBULATORY_CARE_PROVIDER_SITE_OTHER): Payer: Self-pay | Admitting: Ophthalmology

## 2021-12-04 DIAGNOSIS — H353211 Exudative age-related macular degeneration, right eye, with active choroidal neovascularization: Secondary | ICD-10-CM

## 2021-12-04 DIAGNOSIS — H35372 Puckering of macula, left eye: Secondary | ICD-10-CM

## 2021-12-04 DIAGNOSIS — H43822 Vitreomacular adhesion, left eye: Secondary | ICD-10-CM | POA: Diagnosis not present

## 2021-12-04 MED ORDER — BEVACIZUMAB CHEMO INJECTION 1.25MG/0.05ML SYRINGE FOR KALEIDOSCOPE
1.2500 mg | INTRAVITREAL | Status: AC | PRN
  Administered 2021-12-04: 1.25 mg via INTRAVITREAL

## 2021-12-04 NOTE — Assessment & Plan Note (Signed)
Some effect on peripheral perifoveal acuity.  With secondary CME from the VMT traction and ERM.  Consider vitrectomy left eye in the future simply to prevent progression of vision loss

## 2021-12-04 NOTE — Progress Notes (Signed)
12/04/2021     CHIEF COMPLAINT Patient presents for  Chief Complaint  Patient presents with   Macular Degeneration      HISTORY OF PRESENT ILLNESS: Dana Randall is a 71 y.o. female who presents to the clinic today for:   HPI   1 MOS for DILATE OD, AVASTIN OCT. Pt stated no vision changes since last visit. However, pt expressed concerns about having to use a light to read fine print in dim light setting. Last edited by [name] on 12/04/2021  8:28 AM.      Referring physician: Lavone Orn, MD [place]. [address] [place],  [place] [address]  HISTORICAL INFORMATION:   Selected notes from the MEDICAL RECORD NUMBER       CURRENT MEDICATIONS: Current Outpatient Medications (Ophthalmic Drugs)  Medication Sig   prednisoLONE acetate (PRED FORTE) 1 % ophthalmic suspension Place 1 drop into the right eye 2 (two) times daily.   No current facility-administered medications for this visit. (Ophthalmic Drugs)   Current Outpatient Medications (Other)  Medication Sig   aspirin 81 MG chewable tablet Chew 81 mg by mouth daily.   atorvastatin (LIPITOR) 20 MG tablet Take 20 mg by mouth daily.   meclizine (ANTIVERT) 25 MG tablet Take by mouth.   Multiple Vitamins-Minerals (ICAPS AREDS 2 PO) Take 1 mg by mouth daily.   Omega-3 Fatty Acids (FISH OIL) 1000 MG CAPS Take 1,000 mg by mouth daily.   omeprazole (PRILOSEC) 20 MG capsule Take 20 mg by mouth daily.   valACYclovir (VALTREX) 1000 MG tablet Take 1,000 mg by mouth 3 (three) times daily.   No current facility-administered medications for this visit. (Other)      REVIEW OF SYSTEMS: ROS   Negative for: Constitutional, Gastrointestinal, Neurological, Skin, Genitourinary, Musculoskeletal, HENT, Endocrine, Cardiovascular, Eyes, Respiratory, Psychiatric, Allergic/Imm, Heme/Lymph Last edited by [name] on 12/04/2021  8:28 AM.       ALLERGIES Allergies  Allergen Reactions   Codeine Rash    PAST MEDICAL  HISTORY History reviewed. No pertinent past medical history. Past Surgical History:  Procedure Laterality Date   CATARACT EXTRACTION Right 2021   Dr. Prudencio Burly   CATARACT EXTRACTION Left 2020   Dr. Prudencio Burly    FAMILY HISTORY Family History  Problem Relation Age of Onset   Breast cancer Neg Hx     SOCIAL HISTORY Social History   Tobacco Use   Smoking status: Never   Smokeless tobacco: Never  Substance Use Topics   Alcohol use: No   Drug use: No         OPHTHALMIC EXAM:  Base Eye Exam     Visual Acuity (ETDRS)       Right Left   Dist [place] 20/25 -2 CF at 2'         Tonometry (Tonopen, 8:32 AM)       Right Left   Pressure 17 15         Pupils       Pupils APD   Right PERRL None   Left PERRL None         Visual Fields       Left Right     Full   Restrictions Partial inner superior temporal, inferior temporal, superior nasal, inferior nasal deficiencies          Extraocular Movement       Right Left    Full, Ortho Full, Ortho         Neuro/Psych  Oriented x3: Yes   Mood/Affect: Normal         Dilation     Right eye: 2.5% Phenylephrine, 1.0% Mydriacyl @ 8:32 AM           Slit Lamp and Fundus Exam     External Exam       Right Left   External Normal Normal         Slit Lamp Exam       Right Left   Lids/Lashes Normal Normal   Conjunctiva/Sclera White and quiet White and quiet   Cornea Clear Clear   Anterior Chamber Deep and quiet Deep and quiet   Iris Round and reactive Round and reactive   Lens Posterior chamber intraocular lens Posterior chamber intraocular lens, 3+ Posterior capsular opacification   Anterior Vitreous Normal Normal         Fundus Exam       Right Left   Posterior Vitreous Normal    Disc Peripapillary atrophy    C/D Ratio 0.65    Macula Atrophy, Early age related macular degeneration, Retinal pigment epithelial atrophy, Mottling, Retinal pigment epithelial mottling, no macular thickening, no  exudates, no hemorrhage, Pigmented atrophy, mild, Hard drusen    Vessels Normal    Periphery Normal             IMAGING AND PROCEDURES  Imaging and Procedures for 12/04/21  OCT, Retina - OU - Both Eyes           Intravitreal Injection, Pharmacologic Agent - OD - Right Eye                   ASSESSMENT/PLAN:  Exudative age-related macular degeneration of right eye with active choroidal neovascularization (HCC) Remained stable OD on Avastin at 5-week intervals.  Previously at 6-week interval had recurrence Repeat Avastin today looks great  Macular pucker, left eye Some effect on peripheral perifoveal acuity.  With secondary CME from the VMT traction and ERM.  Consider vitrectomy left eye in the future simply to prevent progression of vision loss  Vitreomacular traction syndrome, left OS reviewed with patient, consider vitrectomy in the future     ICD-10-CM   1. Exudative age-related macular degeneration of right eye with active choroidal neovascularization (HCC)  H35.3211 OCT, Retina - OU - Both Eyes    Intravitreal Injection, Pharmacologic Agent - OD - Right Eye    2. Macular pucker, left eye  H35.372     3. Vitreomacular traction syndrome, left  H43.822       1.  OU, dry AMD  2.  OD with recent exacerbation and development of wet AMD.  January 2023 superior to [place].  Controlled on Avastin.  Attempt at recent extension of interval examination in 6 weeks failed with recurrence of subretinal fluid.  Controlled however it close to 1 month to 5-week intervals.  Repeat Avastin today OD  3.  OS May consider vitrectomy in the future to save perifoveal vision.  Not urgent  Ophthalmic Meds Ordered this visit:  No orders of the defined types were placed in this encounter.      Return in about 5 weeks (around 01/08/2022) for dilate, OD, AVASTIN OCT.  There are no Patient Instructions on file for this visit.   Explained the diagnoses, plan, and follow up with the  patient and they expressed understanding.  Patient expressed understanding of the importance of proper follow up care.   Clent Demark [name] M.D. Diseases & Surgery of the Retina  and Vitreous Retina & Diabetic [place] 12/04/21     Abbreviations: M myopia (nearsighted); A astigmatism; H hyperopia (farsighted); P presbyopia; Mrx spectacle prescription;  CTL contact lenses; OD right eye; OS left eye; OU both eyes  XT exotropia; ET esotropia; PEK punctate epithelial keratitis; PEE punctate epithelial erosions; DES dry eye syndrome; MGD meibomian gland dysfunction; ATs artificial tears; PFAT's preservative free artificial tears; [place] nuclear sclerotic cataract; PSC posterior subcapsular cataract; ERM epi-retinal membrane; PVD posterior vitreous detachment; RD retinal detachment; DM diabetes mellitus; DR diabetic retinopathy; NPDR non-proliferative diabetic retinopathy; PDR proliferative diabetic retinopathy; CSME clinically significant macular edema; DME diabetic macular edema; dbh dot blot hemorrhages; CWS cotton wool spot; POAG primary open angle glaucoma; C/D cup-to-disc ratio; HVF humphrey visual field; GVF goldmann visual field; OCT optical coherence tomography; IOP intraocular pressure; BRVO Branch retinal vein occlusion; CRVO central retinal vein occlusion; CRAO central retinal artery occlusion; BRAO branch retinal artery occlusion; RT retinal tear; SB scleral buckle; PPV pars plana vitrectomy; VH Vitreous hemorrhage; PRP panretinal laser photocoagulation; IVK intravitreal kenalog; VMT vitreomacular traction; MH Macular hole;  NVD neovascularization of the disc; NVE neovascularization elsewhere; AREDS age related eye disease study; ARMD age related macular degeneration; POAG primary open angle glaucoma; EBMD epithelial/anterior basement membrane dystrophy; ACIOL anterior chamber intraocular lens; IOL intraocular lens; PCIOL posterior chamber intraocular lens; Phaco/IOL phacoemulsification with intraocular  lens placement; [place] photorefractive keratectomy; LASIK laser assisted in situ keratomileusis; HTN hypertension; DM diabetes mellitus; COPD chronic obstructive pulmonary disease

## 2021-12-04 NOTE — Assessment & Plan Note (Signed)
OS reviewed with patient, consider vitrectomy in the future

## 2021-12-04 NOTE — Assessment & Plan Note (Signed)
Remained stable OD on Avastin at 5-week intervals.  Previously at 6-week interval had recurrence Repeat Avastin today looks great

## 2021-12-06 DIAGNOSIS — H401411 Capsular glaucoma with pseudoexfoliation of lens, right eye, mild stage: Secondary | ICD-10-CM | POA: Diagnosis not present

## 2021-12-26 DIAGNOSIS — Z23 Encounter for immunization: Secondary | ICD-10-CM | POA: Diagnosis not present

## 2022-01-08 ENCOUNTER — Encounter (INDEPENDENT_AMBULATORY_CARE_PROVIDER_SITE_OTHER): Payer: Self-pay | Admitting: Ophthalmology

## 2022-01-08 ENCOUNTER — Ambulatory Visit (INDEPENDENT_AMBULATORY_CARE_PROVIDER_SITE_OTHER): Payer: Medicare Other | Admitting: Ophthalmology

## 2022-01-08 DIAGNOSIS — H353221 Exudative age-related macular degeneration, left eye, with active choroidal neovascularization: Secondary | ICD-10-CM

## 2022-01-08 DIAGNOSIS — H35372 Puckering of macula, left eye: Secondary | ICD-10-CM | POA: Diagnosis not present

## 2022-01-08 DIAGNOSIS — H353124 Nonexudative age-related macular degeneration, left eye, advanced atrophic with subfoveal involvement: Secondary | ICD-10-CM

## 2022-01-08 DIAGNOSIS — H353211 Exudative age-related macular degeneration, right eye, with active choroidal neovascularization: Secondary | ICD-10-CM

## 2022-01-08 MED ORDER — BEVACIZUMAB CHEMO INJECTION 1.25MG/0.05ML SYRINGE FOR KALEIDOSCOPE
2.5000 mg | INTRAVITREAL | Status: AC | PRN
  Administered 2022-01-08: 2.5 mg via INTRAVITREAL

## 2022-01-08 NOTE — Assessment & Plan Note (Signed)
OD doing very well.  Vastly improved now at 5-week interval since exacerbation July 2023.  Now using Avastin 2.5 mg, will repeat injection today and extend interval next to 6 weeks or as needed if patient notices symptoms

## 2022-01-08 NOTE — Assessment & Plan Note (Signed)
No longer active disease OS

## 2022-01-08 NOTE — Assessment & Plan Note (Signed)
Severe by OCT stable

## 2022-01-08 NOTE — Assessment & Plan Note (Signed)
OS subfoveal atrophy and scarring accounts for acuity

## 2022-01-08 NOTE — Progress Notes (Signed)
01/08/2022     CHIEF COMPLAINT Patient presents for  Chief Complaint  Patient presents with   Macular Degeneration      HISTORY OF PRESENT ILLNESS: Dana Randall is a 71 y.o. female who presents to the clinic today for:   HPI   5 weeks for DILATE OD, AVASTIN OCT. Pt stated vision has remained stable.  Last edited by [name] on 01/08/2022  8:02 AM.      Referring physician: Lavone Orn, MD [place] [place],  [place] [address]  HISTORICAL INFORMATION:   Selected notes from the MEDICAL RECORD NUMBER       CURRENT MEDICATIONS: Current Outpatient Medications (Ophthalmic Drugs)  Medication Sig   prednisoLONE acetate (PRED FORTE) 1 % ophthalmic suspension Place 1 drop into the right eye 2 (two) times daily.   No current facility-administered medications for this visit. (Ophthalmic Drugs)   Current Outpatient Medications (Other)  Medication Sig   aspirin 81 MG chewable tablet Chew 81 mg by mouth daily.   atorvastatin (LIPITOR) 20 MG tablet Take 20 mg by mouth daily.   meclizine (ANTIVERT) 25 MG tablet Take by mouth.   Multiple Vitamins-Minerals (ICAPS AREDS 2 PO) Take 1 mg by mouth daily.   Omega-3 Fatty Acids (FISH OIL) 1000 MG CAPS Take 1,000 mg by mouth daily.   omeprazole (PRILOSEC) 20 MG capsule Take 20 mg by mouth daily.   valACYclovir (VALTREX) 1000 MG tablet Take 1,000 mg by mouth 3 (three) times daily.   No current facility-administered medications for this visit. (Other)      REVIEW OF SYSTEMS: ROS   Negative for: Constitutional, Gastrointestinal, Neurological, Skin, Genitourinary, Musculoskeletal, HENT, Endocrine, Cardiovascular, Eyes, Respiratory, Psychiatric, Allergic/Imm, Heme/Lymph Last edited by [name] on 01/08/2022  8:02 AM.       ALLERGIES Allergies  Allergen Reactions   Codeine Rash    PAST MEDICAL HISTORY History reviewed. No pertinent past medical history. Past Surgical History:  Procedure Laterality Date    CATARACT EXTRACTION Right 2021   Dr. Prudencio Burly   CATARACT EXTRACTION Left 2020   Dr. Prudencio Burly    FAMILY HISTORY Family History  Problem Relation Age of Onset   Breast cancer Neg Hx     SOCIAL HISTORY Social History   Tobacco Use   Smoking status: Never   Smokeless tobacco: Never  Substance Use Topics   Alcohol use: No   Drug use: No         OPHTHALMIC EXAM:  Base Eye Exam     Visual Acuity (ETDRS)       Right Left   Dist [place] 20/25 -1 +2 CF at 3'         Tonometry (Tonopen, 8:08 AM)       Right Left   Pressure 15 14         Pupils       Pupils APD   Right PERRL None   Left PERRL None         Visual Fields       Left Right     Full   Restrictions Partial inner superior temporal, inferior temporal, superior nasal, inferior nasal deficiencies          Extraocular Movement       Right Left    Full Full         Neuro/Psych     Oriented x3: Yes   Mood/Affect: Normal         Dilation  Right eye: 2.5% Phenylephrine, 1.0% Mydriacyl @ 8:08 AM           Slit Lamp and Fundus Exam     External Exam       Right Left   External Normal Normal         Slit Lamp Exam       Right Left   Lids/Lashes Normal Normal   Conjunctiva/Sclera White and quiet White and quiet   Cornea Clear Clear   Anterior Chamber Deep and quiet Deep and quiet   Iris Round and reactive Round and reactive   Lens Posterior chamber intraocular lens Posterior chamber intraocular lens, 3+ Posterior capsular opacification   Anterior Vitreous Normal Normal         Fundus Exam       Right Left   Posterior Vitreous Normal    Disc Peripapillary atrophy    C/D Ratio 0.65    Macula Atrophy, Early age related macular degeneration, Retinal pigment epithelial atrophy, Mottling, Retinal pigment epithelial mottling, no macular thickening, no exudates, no hemorrhage, Pigmented atrophy, mild, Hard drusen    Vessels Normal    Periphery Normal              IMAGING AND PROCEDURES  Imaging and Procedures for 01/08/22  OCT, Retina - OU - Both Eyes       Right Eye Quality was good. Scan locations included subfoveal. Central Foveal Thickness: 283. Progression has improved. Findings include normal foveal contour, retinal drusen , intraretinal fluid, subretinal fluid, subretinal scarring.   Left Eye Quality was good. Scan locations included subfoveal. Central Foveal Thickness: 400. Progression has been stable. Findings include abnormal foveal contour, cystoid macular edema, disciform scar, epiretinal membrane, intraretinal fluid, subretinal scarring, vitreous traction, central retinal atrophy, outer retinal atrophy.   Notes OD, recent region of vascularized PED superior to [facility] January 2023 with subretinal fluid and early intraretinal fluid now vastly improved postinjections Avastin, overall yet with new recurrent subretinal fluid superior to FAZ..  These findings were disclosed and reviewed with the patient signifying improvement of recently new CNVM OD, repeat injection today, and this monocular patient to maintain the improvement increase interaval of exam next  OS with large disciform scar severe epiretinal membrane but also now With intraretinal fluid  and CME, which apparently now is clearly from a form from vitreal foveal traction epiretinal Lifting of the inner retina.  No VM traction and no ERM as recent as December 2021.  These findings were reviewed with the patient.   Likely some aspect of no active AMD OS CME stable overall OS,      Intravitreal Injection, Pharmacologic Agent - OD - Right Eye       Time Out 01/08/2022. 8:47 AM. Confirmed correct patient, procedure, site, and patient consented.   Anesthesia Topical anesthesia was used. Anesthetic medications included Lidocaine 4%.   Procedure Preparation included 5% betadine to ocular surface, 10% betadine to eyelids. A 30 gauge needle was used.   Injection: 2.5 mg Bevacizumab  1.'25mg'$ /0.1m   Route: Intravitreal, Site: Right Eye   NDC: [id number] Lot: [id number]  Post-op Post injection exam found visual acuity of at least counting fingers. The patient tolerated the procedure well. There were no complications. The patient received written and verbal post procedure care education. Post injection medications were not given.              ASSESSMENT/PLAN:  Exudative age-related macular degeneration of right eye with active choroidal neovascularization (HCC) OD doing  very well.  Vastly improved now at 5-week interval since exacerbation July 2023.  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 Exudative age-related macular degeneration of right eye with active choroidal neovascularization (HCC)  H35.3211 OCT, Retina - OU - Both Eyes    Intravitreal Injection, Pharmacologic Agent - OD - Right Eye    Bevacizumab (AVASTIN) SOLN 2.5 mg    2. Advanced nonexudative age-related macular degeneration of left eye with subfoveal involvement  H35.3124     3. Exudative age-related macular degeneration of left eye with active choroidal neovascularization ([place])  H35.3221     4. Macular pucker, left eye  H35.372       1.  Repeat injection today Avastin, using 2.5 mg injected today.  Follow-up next in 6 weeks or as needed  2.  3.  Ophthalmic Meds Ordered this visit:  Meds ordered this encounter  Medications   Bevacizumab (AVASTIN) SOLN 2.5 mg       Return in about 6 weeks (around 02/19/2022) for dilate, OD, AVASTIN OCT.  There are  no Patient Instructions on file for this visit.   Explained the diagnoses, plan, and follow up with the patient and they expressed understanding.  Patient expressed understanding of the importance of proper follow up care.   Clent Demark Kendle Erker M.D. Diseases & Surgery of the Retina and Vitreous [facility]ina [facility] [place] 01/08/22     Abbreviations: M myopia (nearsighted); A astigmatism; H hyperopia (farsighted); P presbyopia; Mrx spectacle prescription;  CTL contact lenses; OD right eye; OS left eye; OU both eyes  XT exotropia; ET esotropia; PEK punctate epithelial keratitis; PEE punctate epithelial erosions; DES dry eye syndrome; MGD meibomian gland dysfunction; ATs artificial tears; PFAT's preservative free artificial tears; Herscher nuclear sclerotic cataract; PSC posterior subcapsular cataract; ERM epi-retinal membrane; PVD posterior vitreous detachment; RD retinal detachment; DM diabetes mellitus; DR diabetic retinopathy; NPDR non-proliferative diabetic retinopathy; PDR proliferative diabetic retinopathy; CSME clinically significant macular edema; DME diabetic macular edema; dbh dot blot hemorrhages; CWS cotton wool spot; POAG primary open angle glaucoma; C/D cup-to-disc ratio; HVF humphrey visual field; GVF goldmann visual field; OCT optical coherence tomography; IOP intraocular pressure; BRVO Branch retinal vein occlusion; CRVO central retinal vein occlusion; CRAO central retinal artery occlusion; BRAO branch retinal artery occlusion; RT retinal tear; SB scleral buckle; PPV pars plana vitrectomy; VH Vitreous hemorrhage; PRP panretinal laser photocoagulation; IVK intravitreal kenalog; VMT vitreomacular traction; MH Macular hole;  NVD neovascularization of the disc; NVE neovascularization elsewhere; AREDS age related eye disease study; ARMD age related macular degeneration; POAG primary open angle glaucoma; EBMD epithelial/anterior basement membrane dystrophy; ACIOL anterior chamber intraocular lens; IOL  intraocular lens; PCIOL posterior chamber intraocular lens; Phaco/IOL phacoemulsification with intraocular lens placement; Mayer photorefractive keratectomy; LASIK laser assisted in situ keratomileusis; HTN hypertension; DM diabetes mellitus; COPD chronic obstructive pulmonary disease

## 2022-02-20 ENCOUNTER — Encounter (INDEPENDENT_AMBULATORY_CARE_PROVIDER_SITE_OTHER): Payer: Self-pay

## 2022-02-20 ENCOUNTER — Encounter (INDEPENDENT_AMBULATORY_CARE_PROVIDER_SITE_OTHER): Payer: Medicare Other | Admitting: Ophthalmology

## 2022-02-20 DIAGNOSIS — H43822 Vitreomacular adhesion, left eye: Secondary | ICD-10-CM | POA: Diagnosis not present

## 2022-02-20 DIAGNOSIS — H353124 Nonexudative age-related macular degeneration, left eye, advanced atrophic with subfoveal involvement: Secondary | ICD-10-CM | POA: Diagnosis not present

## 2022-02-20 DIAGNOSIS — H35352 Cystoid macular degeneration, left eye: Secondary | ICD-10-CM | POA: Diagnosis not present

## 2022-02-20 DIAGNOSIS — H353211 Exudative age-related macular degeneration, right eye, with active choroidal neovascularization: Secondary | ICD-10-CM | POA: Diagnosis not present

## 2022-02-20 DIAGNOSIS — H35372 Puckering of macula, left eye: Secondary | ICD-10-CM | POA: Diagnosis not present

## 2022-02-20 DIAGNOSIS — H353221 Exudative age-related macular degeneration, left eye, with active choroidal neovascularization: Secondary | ICD-10-CM | POA: Diagnosis not present

## 2022-02-20 DIAGNOSIS — H353111 Nonexudative age-related macular degeneration, right eye, early dry stage: Secondary | ICD-10-CM | POA: Diagnosis not present

## 2022-03-26 DIAGNOSIS — H353211 Exudative age-related macular degeneration, right eye, with active choroidal neovascularization: Secondary | ICD-10-CM | POA: Diagnosis not present

## 2022-03-26 DIAGNOSIS — H43822 Vitreomacular adhesion, left eye: Secondary | ICD-10-CM | POA: Diagnosis not present

## 2022-03-26 DIAGNOSIS — H353124 Nonexudative age-related macular degeneration, left eye, advanced atrophic with subfoveal involvement: Secondary | ICD-10-CM | POA: Diagnosis not present

## 2022-03-26 DIAGNOSIS — H35372 Puckering of macula, left eye: Secondary | ICD-10-CM | POA: Diagnosis not present

## 2022-03-26 DIAGNOSIS — H353221 Exudative age-related macular degeneration, left eye, with active choroidal neovascularization: Secondary | ICD-10-CM | POA: Diagnosis not present

## 2022-03-26 DIAGNOSIS — H35352 Cystoid macular degeneration, left eye: Secondary | ICD-10-CM | POA: Diagnosis not present

## 2022-03-26 DIAGNOSIS — H353111 Nonexudative age-related macular degeneration, right eye, early dry stage: Secondary | ICD-10-CM | POA: Diagnosis not present

## 2022-04-30 DIAGNOSIS — H35372 Puckering of macula, left eye: Secondary | ICD-10-CM | POA: Diagnosis not present

## 2022-04-30 DIAGNOSIS — H353211 Exudative age-related macular degeneration, right eye, with active choroidal neovascularization: Secondary | ICD-10-CM | POA: Diagnosis not present

## 2022-04-30 DIAGNOSIS — H353111 Nonexudative age-related macular degeneration, right eye, early dry stage: Secondary | ICD-10-CM | POA: Diagnosis not present

## 2022-04-30 DIAGNOSIS — H35352 Cystoid macular degeneration, left eye: Secondary | ICD-10-CM | POA: Diagnosis not present

## 2022-04-30 DIAGNOSIS — H353124 Nonexudative age-related macular degeneration, left eye, advanced atrophic with subfoveal involvement: Secondary | ICD-10-CM | POA: Diagnosis not present

## 2022-04-30 DIAGNOSIS — H353221 Exudative age-related macular degeneration, left eye, with active choroidal neovascularization: Secondary | ICD-10-CM | POA: Diagnosis not present

## 2022-06-06 DIAGNOSIS — H35352 Cystoid macular degeneration, left eye: Secondary | ICD-10-CM | POA: Diagnosis not present

## 2022-06-06 DIAGNOSIS — H353211 Exudative age-related macular degeneration, right eye, with active choroidal neovascularization: Secondary | ICD-10-CM | POA: Diagnosis not present

## 2022-06-06 DIAGNOSIS — H35372 Puckering of macula, left eye: Secondary | ICD-10-CM | POA: Diagnosis not present

## 2022-06-13 DIAGNOSIS — H401431 Capsular glaucoma with pseudoexfoliation of lens, bilateral, mild stage: Secondary | ICD-10-CM | POA: Diagnosis not present

## 2022-06-13 DIAGNOSIS — H26493 Other secondary cataract, bilateral: Secondary | ICD-10-CM | POA: Diagnosis not present

## 2022-06-13 DIAGNOSIS — Z961 Presence of intraocular lens: Secondary | ICD-10-CM | POA: Diagnosis not present

## 2022-06-20 ENCOUNTER — Encounter: Payer: Self-pay | Admitting: Radiology

## 2022-07-16 DIAGNOSIS — Z85820 Personal history of malignant melanoma of skin: Secondary | ICD-10-CM | POA: Diagnosis not present

## 2022-07-16 DIAGNOSIS — H9312 Tinnitus, left ear: Secondary | ICD-10-CM | POA: Diagnosis not present

## 2022-07-16 DIAGNOSIS — R519 Headache, unspecified: Secondary | ICD-10-CM | POA: Diagnosis not present

## 2022-07-16 DIAGNOSIS — G500 Trigeminal neuralgia: Secondary | ICD-10-CM | POA: Diagnosis not present

## 2022-07-17 ENCOUNTER — Other Ambulatory Visit: Payer: Self-pay | Admitting: Internal Medicine

## 2022-07-17 DIAGNOSIS — R519 Headache, unspecified: Secondary | ICD-10-CM

## 2022-07-17 DIAGNOSIS — G500 Trigeminal neuralgia: Secondary | ICD-10-CM

## 2022-07-17 DIAGNOSIS — Z85820 Personal history of malignant melanoma of skin: Secondary | ICD-10-CM

## 2022-07-18 DIAGNOSIS — H353211 Exudative age-related macular degeneration, right eye, with active choroidal neovascularization: Secondary | ICD-10-CM | POA: Diagnosis not present

## 2022-07-18 DIAGNOSIS — H353221 Exudative age-related macular degeneration, left eye, with active choroidal neovascularization: Secondary | ICD-10-CM | POA: Diagnosis not present

## 2022-07-18 DIAGNOSIS — H26492 Other secondary cataract, left eye: Secondary | ICD-10-CM | POA: Diagnosis not present

## 2022-07-18 DIAGNOSIS — H43822 Vitreomacular adhesion, left eye: Secondary | ICD-10-CM | POA: Diagnosis not present

## 2022-07-18 DIAGNOSIS — H353124 Nonexudative age-related macular degeneration, left eye, advanced atrophic with subfoveal involvement: Secondary | ICD-10-CM | POA: Diagnosis not present

## 2022-07-18 DIAGNOSIS — H35352 Cystoid macular degeneration, left eye: Secondary | ICD-10-CM | POA: Diagnosis not present

## 2022-07-18 DIAGNOSIS — H353111 Nonexudative age-related macular degeneration, right eye, early dry stage: Secondary | ICD-10-CM | POA: Diagnosis not present

## 2022-07-18 DIAGNOSIS — H401112 Primary open-angle glaucoma, right eye, moderate stage: Secondary | ICD-10-CM | POA: Diagnosis not present

## 2022-07-18 DIAGNOSIS — H35372 Puckering of macula, left eye: Secondary | ICD-10-CM | POA: Diagnosis not present

## 2022-08-10 ENCOUNTER — Ambulatory Visit
Admission: RE | Admit: 2022-08-10 | Discharge: 2022-08-10 | Disposition: A | Discharge status: Home / Self Care | Payer: Medicare Other | Source: Ambulatory Visit | Attending: Internal Medicine | Admitting: Internal Medicine

## 2022-08-10 DIAGNOSIS — R519 Headache, unspecified: Secondary | ICD-10-CM

## 2022-08-10 DIAGNOSIS — Z85820 Personal history of malignant melanoma of skin: Secondary | ICD-10-CM

## 2022-08-10 DIAGNOSIS — G500 Trigeminal neuralgia: Secondary | ICD-10-CM

## 2022-08-14 ENCOUNTER — Other Ambulatory Visit: Payer: Self-pay | Admitting: Internal Medicine

## 2022-08-14 DIAGNOSIS — Z1231 Encounter for screening mammogram for malignant neoplasm of breast: Secondary | ICD-10-CM

## 2022-08-22 ENCOUNTER — Ambulatory Visit: Payer: Medicare Other

## 2022-08-26 DIAGNOSIS — R202 Paresthesia of skin: Secondary | ICD-10-CM | POA: Diagnosis not present

## 2022-08-26 DIAGNOSIS — M79671 Pain in right foot: Secondary | ICD-10-CM | POA: Diagnosis not present

## 2022-08-26 DIAGNOSIS — G500 Trigeminal neuralgia: Secondary | ICD-10-CM | POA: Diagnosis not present

## 2022-08-26 DIAGNOSIS — M79604 Pain in right leg: Secondary | ICD-10-CM | POA: Diagnosis not present

## 2022-08-26 DIAGNOSIS — R519 Headache, unspecified: Secondary | ICD-10-CM | POA: Diagnosis not present

## 2022-08-26 DIAGNOSIS — M79672 Pain in left foot: Secondary | ICD-10-CM | POA: Diagnosis not present

## 2022-08-26 DIAGNOSIS — M79605 Pain in left leg: Secondary | ICD-10-CM | POA: Diagnosis not present

## 2022-08-29 DIAGNOSIS — H353111 Nonexudative age-related macular degeneration, right eye, early dry stage: Secondary | ICD-10-CM | POA: Diagnosis not present

## 2022-08-29 DIAGNOSIS — H353124 Nonexudative age-related macular degeneration, left eye, advanced atrophic with subfoveal involvement: Secondary | ICD-10-CM | POA: Diagnosis not present

## 2022-08-29 DIAGNOSIS — H353211 Exudative age-related macular degeneration, right eye, with active choroidal neovascularization: Secondary | ICD-10-CM | POA: Diagnosis not present

## 2022-08-29 DIAGNOSIS — H3562 Retinal hemorrhage, left eye: Secondary | ICD-10-CM | POA: Diagnosis not present

## 2022-08-29 DIAGNOSIS — H35352 Cystoid macular degeneration, left eye: Secondary | ICD-10-CM | POA: Diagnosis not present

## 2022-08-29 DIAGNOSIS — H26492 Other secondary cataract, left eye: Secondary | ICD-10-CM | POA: Diagnosis not present

## 2022-08-29 DIAGNOSIS — H353221 Exudative age-related macular degeneration, left eye, with active choroidal neovascularization: Secondary | ICD-10-CM | POA: Diagnosis not present

## 2022-08-29 DIAGNOSIS — H401112 Primary open-angle glaucoma, right eye, moderate stage: Secondary | ICD-10-CM | POA: Diagnosis not present

## 2022-09-11 ENCOUNTER — Ambulatory Visit
Admission: RE | Admit: 2022-09-11 | Discharge: 2022-09-11 | Disposition: A | Discharge status: Home / Self Care | Payer: Medicare Other | Source: Ambulatory Visit | Attending: Internal Medicine | Admitting: Internal Medicine

## 2022-09-11 DIAGNOSIS — Z1231 Encounter for screening mammogram for malignant neoplasm of breast: Secondary | ICD-10-CM | POA: Diagnosis not present

## 2022-09-12 DIAGNOSIS — L259 Unspecified contact dermatitis, unspecified cause: Secondary | ICD-10-CM | POA: Diagnosis not present

## 2022-10-15 DIAGNOSIS — H353124 Nonexudative age-related macular degeneration, left eye, advanced atrophic with subfoveal involvement: Secondary | ICD-10-CM | POA: Diagnosis not present

## 2022-10-15 DIAGNOSIS — H26492 Other secondary cataract, left eye: Secondary | ICD-10-CM | POA: Diagnosis not present

## 2022-10-15 DIAGNOSIS — H353211 Exudative age-related macular degeneration, right eye, with active choroidal neovascularization: Secondary | ICD-10-CM | POA: Diagnosis not present

## 2022-10-15 DIAGNOSIS — H3562 Retinal hemorrhage, left eye: Secondary | ICD-10-CM | POA: Diagnosis not present

## 2022-10-15 DIAGNOSIS — H35352 Cystoid macular degeneration, left eye: Secondary | ICD-10-CM | POA: Diagnosis not present

## 2022-10-15 DIAGNOSIS — H401112 Primary open-angle glaucoma, right eye, moderate stage: Secondary | ICD-10-CM | POA: Diagnosis not present

## 2022-10-15 DIAGNOSIS — H353111 Nonexudative age-related macular degeneration, right eye, early dry stage: Secondary | ICD-10-CM | POA: Diagnosis not present

## 2022-10-15 DIAGNOSIS — H353221 Exudative age-related macular degeneration, left eye, with active choroidal neovascularization: Secondary | ICD-10-CM | POA: Diagnosis not present

## 2022-11-19 DIAGNOSIS — H26493 Other secondary cataract, bilateral: Secondary | ICD-10-CM | POA: Diagnosis not present

## 2022-11-19 DIAGNOSIS — H353211 Exudative age-related macular degeneration, right eye, with active choroidal neovascularization: Secondary | ICD-10-CM | POA: Diagnosis not present

## 2022-11-19 DIAGNOSIS — H43822 Vitreomacular adhesion, left eye: Secondary | ICD-10-CM | POA: Diagnosis not present

## 2022-11-19 DIAGNOSIS — H35372 Puckering of macula, left eye: Secondary | ICD-10-CM | POA: Diagnosis not present

## 2022-11-19 DIAGNOSIS — H353111 Nonexudative age-related macular degeneration, right eye, early dry stage: Secondary | ICD-10-CM | POA: Diagnosis not present

## 2022-11-19 DIAGNOSIS — H353124 Nonexudative age-related macular degeneration, left eye, advanced atrophic with subfoveal involvement: Secondary | ICD-10-CM | POA: Diagnosis not present

## 2022-11-19 DIAGNOSIS — H353221 Exudative age-related macular degeneration, left eye, with active choroidal neovascularization: Secondary | ICD-10-CM | POA: Diagnosis not present

## 2022-11-19 DIAGNOSIS — H35352 Cystoid macular degeneration, left eye: Secondary | ICD-10-CM | POA: Diagnosis not present

## 2022-11-19 DIAGNOSIS — H401112 Primary open-angle glaucoma, right eye, moderate stage: Secondary | ICD-10-CM | POA: Diagnosis not present

## 2022-11-19 DIAGNOSIS — H3562 Retinal hemorrhage, left eye: Secondary | ICD-10-CM | POA: Diagnosis not present

## 2022-12-12 DIAGNOSIS — H401411 Capsular glaucoma with pseudoexfoliation of lens, right eye, mild stage: Secondary | ICD-10-CM | POA: Diagnosis not present

## 2022-12-12 DIAGNOSIS — H26491 Other secondary cataract, right eye: Secondary | ICD-10-CM | POA: Diagnosis not present

## 2022-12-12 DIAGNOSIS — H26492 Other secondary cataract, left eye: Secondary | ICD-10-CM | POA: Diagnosis not present

## 2022-12-12 DIAGNOSIS — Z961 Presence of intraocular lens: Secondary | ICD-10-CM | POA: Diagnosis not present

## 2022-12-26 DIAGNOSIS — Z85820 Personal history of malignant melanoma of skin: Secondary | ICD-10-CM | POA: Diagnosis not present

## 2022-12-26 DIAGNOSIS — G500 Trigeminal neuralgia: Secondary | ICD-10-CM | POA: Diagnosis not present

## 2022-12-26 DIAGNOSIS — M2042 Other hammer toe(s) (acquired), left foot: Secondary | ICD-10-CM | POA: Diagnosis not present

## 2022-12-26 DIAGNOSIS — M2041 Other hammer toe(s) (acquired), right foot: Secondary | ICD-10-CM | POA: Diagnosis not present

## 2022-12-26 DIAGNOSIS — H8110 Benign paroxysmal vertigo, unspecified ear: Secondary | ICD-10-CM | POA: Diagnosis not present

## 2022-12-26 DIAGNOSIS — Z79899 Other long term (current) drug therapy: Secondary | ICD-10-CM | POA: Diagnosis not present

## 2022-12-26 DIAGNOSIS — G8929 Other chronic pain: Secondary | ICD-10-CM | POA: Diagnosis not present

## 2022-12-26 DIAGNOSIS — Z0000 Encounter for general adult medical examination without abnormal findings: Secondary | ICD-10-CM | POA: Diagnosis not present

## 2022-12-26 DIAGNOSIS — Z1331 Encounter for screening for depression: Secondary | ICD-10-CM | POA: Diagnosis not present

## 2022-12-26 DIAGNOSIS — E559 Vitamin D deficiency, unspecified: Secondary | ICD-10-CM | POA: Diagnosis not present

## 2022-12-26 DIAGNOSIS — E7800 Pure hypercholesterolemia, unspecified: Secondary | ICD-10-CM | POA: Diagnosis not present

## 2022-12-26 DIAGNOSIS — R202 Paresthesia of skin: Secondary | ICD-10-CM | POA: Diagnosis not present

## 2022-12-26 DIAGNOSIS — M170 Bilateral primary osteoarthritis of knee: Secondary | ICD-10-CM | POA: Diagnosis not present

## 2022-12-26 DIAGNOSIS — R7301 Impaired fasting glucose: Secondary | ICD-10-CM | POA: Diagnosis not present

## 2022-12-31 DIAGNOSIS — H353111 Nonexudative age-related macular degeneration, right eye, early dry stage: Secondary | ICD-10-CM | POA: Diagnosis not present

## 2022-12-31 DIAGNOSIS — H353211 Exudative age-related macular degeneration, right eye, with active choroidal neovascularization: Secondary | ICD-10-CM | POA: Diagnosis not present

## 2022-12-31 DIAGNOSIS — H401113 Primary open-angle glaucoma, right eye, severe stage: Secondary | ICD-10-CM | POA: Diagnosis not present

## 2022-12-31 DIAGNOSIS — H4051X3 Glaucoma secondary to other eye disorders, right eye, severe stage: Secondary | ICD-10-CM | POA: Diagnosis not present

## 2022-12-31 DIAGNOSIS — H35352 Cystoid macular degeneration, left eye: Secondary | ICD-10-CM | POA: Diagnosis not present

## 2023-01-22 ENCOUNTER — Other Ambulatory Visit: Payer: Self-pay | Admitting: Internal Medicine

## 2023-01-22 DIAGNOSIS — Z13820 Encounter for screening for osteoporosis: Secondary | ICD-10-CM

## 2023-01-28 DIAGNOSIS — D649 Anemia, unspecified: Secondary | ICD-10-CM | POA: Diagnosis not present

## 2023-01-31 DIAGNOSIS — D485 Neoplasm of uncertain behavior of skin: Secondary | ICD-10-CM | POA: Diagnosis not present

## 2023-01-31 DIAGNOSIS — Z08 Encounter for follow-up examination after completed treatment for malignant neoplasm: Secondary | ICD-10-CM | POA: Diagnosis not present

## 2023-01-31 DIAGNOSIS — D225 Melanocytic nevi of trunk: Secondary | ICD-10-CM | POA: Diagnosis not present

## 2023-01-31 DIAGNOSIS — L910 Hypertrophic scar: Secondary | ICD-10-CM | POA: Diagnosis not present

## 2023-01-31 DIAGNOSIS — Z1283 Encounter for screening for malignant neoplasm of skin: Secondary | ICD-10-CM | POA: Diagnosis not present

## 2023-01-31 DIAGNOSIS — Z85820 Personal history of malignant melanoma of skin: Secondary | ICD-10-CM | POA: Diagnosis not present

## 2023-01-31 DIAGNOSIS — L570 Actinic keratosis: Secondary | ICD-10-CM | POA: Diagnosis not present

## 2023-01-31 DIAGNOSIS — X32XXXD Exposure to sunlight, subsequent encounter: Secondary | ICD-10-CM | POA: Diagnosis not present

## 2023-02-11 DIAGNOSIS — H26491 Other secondary cataract, right eye: Secondary | ICD-10-CM | POA: Diagnosis not present

## 2023-02-11 DIAGNOSIS — H35352 Cystoid macular degeneration, left eye: Secondary | ICD-10-CM | POA: Diagnosis not present

## 2023-02-11 DIAGNOSIS — H43822 Vitreomacular adhesion, left eye: Secondary | ICD-10-CM | POA: Diagnosis not present

## 2023-02-11 DIAGNOSIS — H353211 Exudative age-related macular degeneration, right eye, with active choroidal neovascularization: Secondary | ICD-10-CM | POA: Diagnosis not present

## 2023-02-11 DIAGNOSIS — H3562 Retinal hemorrhage, left eye: Secondary | ICD-10-CM | POA: Diagnosis not present

## 2023-02-11 DIAGNOSIS — H35373 Puckering of macula, bilateral: Secondary | ICD-10-CM | POA: Diagnosis not present

## 2023-02-11 DIAGNOSIS — H353111 Nonexudative age-related macular degeneration, right eye, early dry stage: Secondary | ICD-10-CM | POA: Diagnosis not present

## 2023-02-11 DIAGNOSIS — H353124 Nonexudative age-related macular degeneration, left eye, advanced atrophic with subfoveal involvement: Secondary | ICD-10-CM | POA: Diagnosis not present

## 2023-02-11 DIAGNOSIS — H401113 Primary open-angle glaucoma, right eye, severe stage: Secondary | ICD-10-CM | POA: Diagnosis not present

## 2023-02-11 DIAGNOSIS — H353221 Exudative age-related macular degeneration, left eye, with active choroidal neovascularization: Secondary | ICD-10-CM | POA: Diagnosis not present

## 2023-02-19 DIAGNOSIS — M2041 Other hammer toe(s) (acquired), right foot: Secondary | ICD-10-CM | POA: Diagnosis not present

## 2023-02-19 DIAGNOSIS — M2042 Other hammer toe(s) (acquired), left foot: Secondary | ICD-10-CM | POA: Diagnosis not present

## 2023-02-24 DIAGNOSIS — D649 Anemia, unspecified: Secondary | ICD-10-CM | POA: Diagnosis not present

## 2023-03-05 DIAGNOSIS — H15832 Staphyloma posticum, left eye: Secondary | ICD-10-CM | POA: Diagnosis not present

## 2023-03-05 DIAGNOSIS — H35372 Puckering of macula, left eye: Secondary | ICD-10-CM | POA: Diagnosis not present

## 2023-03-11 DIAGNOSIS — H353221 Exudative age-related macular degeneration, left eye, with active choroidal neovascularization: Secondary | ICD-10-CM | POA: Diagnosis not present

## 2023-03-11 DIAGNOSIS — H16301 Unspecified interstitial keratitis, right eye: Secondary | ICD-10-CM | POA: Diagnosis not present

## 2023-03-11 DIAGNOSIS — H353111 Nonexudative age-related macular degeneration, right eye, early dry stage: Secondary | ICD-10-CM | POA: Diagnosis not present

## 2023-03-11 DIAGNOSIS — H35371 Puckering of macula, right eye: Secondary | ICD-10-CM | POA: Diagnosis not present

## 2023-03-11 DIAGNOSIS — H35352 Cystoid macular degeneration, left eye: Secondary | ICD-10-CM | POA: Diagnosis not present

## 2023-03-11 DIAGNOSIS — H35372 Puckering of macula, left eye: Secondary | ICD-10-CM | POA: Diagnosis not present

## 2023-03-11 DIAGNOSIS — H353211 Exudative age-related macular degeneration, right eye, with active choroidal neovascularization: Secondary | ICD-10-CM | POA: Diagnosis not present

## 2023-03-11 DIAGNOSIS — H353124 Nonexudative age-related macular degeneration, left eye, advanced atrophic with subfoveal involvement: Secondary | ICD-10-CM | POA: Diagnosis not present

## 2023-03-11 DIAGNOSIS — H4051X3 Glaucoma secondary to other eye disorders, right eye, severe stage: Secondary | ICD-10-CM | POA: Diagnosis not present

## 2023-03-11 DIAGNOSIS — H3562 Retinal hemorrhage, left eye: Secondary | ICD-10-CM | POA: Diagnosis not present

## 2023-04-01 DIAGNOSIS — H20012 Primary iridocyclitis, left eye: Secondary | ICD-10-CM | POA: Diagnosis not present

## 2023-04-01 DIAGNOSIS — H353221 Exudative age-related macular degeneration, left eye, with active choroidal neovascularization: Secondary | ICD-10-CM | POA: Diagnosis not present

## 2023-04-01 DIAGNOSIS — H353111 Nonexudative age-related macular degeneration, right eye, early dry stage: Secondary | ICD-10-CM | POA: Diagnosis not present

## 2023-04-01 DIAGNOSIS — H4042X1 Glaucoma secondary to eye inflammation, left eye, mild stage: Secondary | ICD-10-CM | POA: Diagnosis not present

## 2023-04-01 DIAGNOSIS — H401113 Primary open-angle glaucoma, right eye, severe stage: Secondary | ICD-10-CM | POA: Diagnosis not present

## 2023-04-01 DIAGNOSIS — H353124 Nonexudative age-related macular degeneration, left eye, advanced atrophic with subfoveal involvement: Secondary | ICD-10-CM | POA: Diagnosis not present

## 2023-04-01 DIAGNOSIS — H35352 Cystoid macular degeneration, left eye: Secondary | ICD-10-CM | POA: Diagnosis not present

## 2023-04-01 DIAGNOSIS — H3562 Retinal hemorrhage, left eye: Secondary | ICD-10-CM | POA: Diagnosis not present

## 2023-04-01 DIAGNOSIS — H353211 Exudative age-related macular degeneration, right eye, with active choroidal neovascularization: Secondary | ICD-10-CM | POA: Diagnosis not present

## 2023-04-21 DIAGNOSIS — H35371 Puckering of macula, right eye: Secondary | ICD-10-CM | POA: Diagnosis not present

## 2023-04-21 DIAGNOSIS — H4042X1 Glaucoma secondary to eye inflammation, left eye, mild stage: Secondary | ICD-10-CM | POA: Diagnosis not present

## 2023-04-21 DIAGNOSIS — H20012 Primary iridocyclitis, left eye: Secondary | ICD-10-CM | POA: Diagnosis not present

## 2023-04-21 DIAGNOSIS — H4051X3 Glaucoma secondary to other eye disorders, right eye, severe stage: Secondary | ICD-10-CM | POA: Diagnosis not present

## 2023-04-21 DIAGNOSIS — H3562 Retinal hemorrhage, left eye: Secondary | ICD-10-CM | POA: Diagnosis not present

## 2023-04-21 DIAGNOSIS — H401113 Primary open-angle glaucoma, right eye, severe stage: Secondary | ICD-10-CM | POA: Diagnosis not present

## 2023-04-21 DIAGNOSIS — H353211 Exudative age-related macular degeneration, right eye, with active choroidal neovascularization: Secondary | ICD-10-CM | POA: Diagnosis not present

## 2023-04-21 DIAGNOSIS — H353221 Exudative age-related macular degeneration, left eye, with active choroidal neovascularization: Secondary | ICD-10-CM | POA: Diagnosis not present

## 2023-04-21 DIAGNOSIS — H35352 Cystoid macular degeneration, left eye: Secondary | ICD-10-CM | POA: Diagnosis not present

## 2023-04-21 DIAGNOSIS — H353124 Nonexudative age-related macular degeneration, left eye, advanced atrophic with subfoveal involvement: Secondary | ICD-10-CM | POA: Diagnosis not present

## 2023-04-21 DIAGNOSIS — H353111 Nonexudative age-related macular degeneration, right eye, early dry stage: Secondary | ICD-10-CM | POA: Diagnosis not present

## 2023-08-19 ENCOUNTER — Ambulatory Visit
Admission: RE | Admit: 2023-08-19 | Discharge: 2023-08-19 | Disposition: A | Discharge status: Home / Self Care | Payer: Medicare Other | Source: Ambulatory Visit | Attending: Internal Medicine | Admitting: Internal Medicine

## 2023-08-19 DIAGNOSIS — Z13820 Encounter for screening for osteoporosis: Secondary | ICD-10-CM

## 2023-09-23 ENCOUNTER — Other Ambulatory Visit: Payer: Self-pay | Admitting: Internal Medicine

## 2023-09-23 DIAGNOSIS — Z1231 Encounter for screening mammogram for malignant neoplasm of breast: Secondary | ICD-10-CM

## 2023-09-28 ENCOUNTER — Ambulatory Visit
Admission: RE | Admit: 2023-09-28 | Discharge: 2023-09-28 | Disposition: A | Discharge status: Home / Self Care | Source: Ambulatory Visit

## 2023-09-28 VITALS — BP 130/89 | HR 94 | Temp 97.8°F | Resp 18 | Ht 64.5 in | Wt 151.0 lb

## 2023-09-28 DIAGNOSIS — R7301 Impaired fasting glucose: Secondary | ICD-10-CM | POA: Insufficient documentation

## 2023-09-28 DIAGNOSIS — M5030 Other cervical disc degeneration, unspecified cervical region: Secondary | ICD-10-CM | POA: Insufficient documentation

## 2023-09-28 DIAGNOSIS — M1711 Unilateral primary osteoarthritis, right knee: Secondary | ICD-10-CM | POA: Insufficient documentation

## 2023-09-28 DIAGNOSIS — H8110 Benign paroxysmal vertigo, unspecified ear: Secondary | ICD-10-CM | POA: Insufficient documentation

## 2023-09-28 DIAGNOSIS — H409 Unspecified glaucoma: Secondary | ICD-10-CM | POA: Insufficient documentation

## 2023-09-28 DIAGNOSIS — M170 Bilateral primary osteoarthritis of knee: Secondary | ICD-10-CM | POA: Insufficient documentation

## 2023-09-28 DIAGNOSIS — Z85820 Personal history of malignant melanoma of skin: Secondary | ICD-10-CM | POA: Insufficient documentation

## 2023-09-28 DIAGNOSIS — B029 Zoster without complications: Secondary | ICD-10-CM

## 2023-09-28 DIAGNOSIS — J309 Allergic rhinitis, unspecified: Secondary | ICD-10-CM | POA: Insufficient documentation

## 2023-09-28 DIAGNOSIS — H401113 Primary open-angle glaucoma, right eye, severe stage: Secondary | ICD-10-CM | POA: Insufficient documentation

## 2023-09-28 DIAGNOSIS — G8929 Other chronic pain: Secondary | ICD-10-CM | POA: Insufficient documentation

## 2023-09-28 DIAGNOSIS — H3530 Unspecified macular degeneration: Secondary | ICD-10-CM | POA: Insufficient documentation

## 2023-09-28 DIAGNOSIS — E669 Obesity, unspecified: Secondary | ICD-10-CM | POA: Insufficient documentation

## 2023-09-28 MED ORDER — VALACYCLOVIR HCL 1 G PO TABS: 1000.0000 mg | ORAL_TABLET | Freq: Three times a day (TID) | ORAL | 0 refills | Status: DC

## 2023-09-28 NOTE — ED Triage Notes (Signed)
 Rash and tingling sensation  Small blisters around it No fever On right  calf - Entered by patient

## 2023-09-28 NOTE — ED Provider Notes (Signed)
 [facility]    CSN: [id number] Arrival date & time: 09/28/23  1144      History   Chief Complaint Chief Complaint  Patient presents with   Rash    HPI Dana Randall is a 73 y.o. female.   Patient here today for evaluation of rash with associated burning and tingling to her right posterior calf.  She reports that she has some blistering to the area.  She denies any fever.  She does note that she had been outside the day prior to this appearing in the morning the next day  She has been using topical calamine spray which does help somewhat with the itching but does not help with burning.  The history is provided by the patient.  Rash Associated symptoms: no abdominal pain, no fever, no nausea, no shortness of breath and not vomiting     History reviewed. No pertinent past medical history.  Patient Active Problem List   Diagnosis Date Noted   History of melanoma 09/28/2023   Glaucoma 09/28/2023   Degenerative disc disease, cervical 09/28/2023   Benign positional vertigo 09/28/2023   Arthritis of right knee 09/28/2023   Arthritis of both knees 09/28/2023   Allergic rhinitis 09/28/2023   Impaired fasting glucose 09/28/2023   Obesity 09/28/2023   Macular degeneration 09/28/2023   Other chronic pain 09/28/2023   Primary open angle glaucoma (POAG) of right eye, severe stage 09/28/2023   Primary osteoarthritis of both knees 09/28/2023   Exudative age-related macular degeneration of right eye with active choroidal neovascularization (HCC) 05/02/2021   Vitreomacular traction syndrome, 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   Posterior capsular opacification, left eye 03/01/2021   Macular pucker, left eye 10/19/2020   Tinnitus of left ear 07/26/2020   Sensorineural hearing loss (SNHL) of both ears 07/26/2020   Early stage nonexudative age-related macular degeneration of right eye 04/19/2020   Stromal keratitis, right 01/19/2020   Exudative age-related macular degeneration of left eye  with active choroidal neovascularization (HCC) 08/11/2019   Advanced nonexudative age-related macular degeneration of left eye with subfoveal involvement 08/11/2019   Retinal hemorrhage of left eye 08/11/2019   Hypercholesterolemia 09/23/2016   Malignant melanoma (HCC) 12/13/2014    Past Surgical History:  Procedure Laterality Date   CATARACT EXTRACTION Right 2021   Dr. Terrall Ferraris   CATARACT EXTRACTION Left 2020   Dr. Terrall Ferraris    OB History   No obstetric history on file.      Home Medications    Prior to Admission medications   Medication Sig Start Date End Date Taking? Authorizing Provider  ALPRAZolam (XANAX) 0.5 MG tablet Take 0.5 mg by mouth as needed. 10/12/19  Yes [provider]  ascorbic acid (VITAMIN C) 1000 MG tablet Take 1,000 mg by mouth daily. 07/26/20  Yes [provider]  bacitracin-polymyxin b (POLYSPORIN) ophthalmic ointment Place 1 Application into both eyes 3 (three) times daily. 08/06/23  Yes [provider]  bimatoprost (LUMIGAN) 0.01 % SOLN Place 1 drop into both eyes at bedtime. 11/11/11  Yes [provider]  brimonidine (ALPHAGAN P) 0.1 % SOLN Place into both eyes every 8 (eight) hours. 11/08/13  Yes [provider]  Cholecalciferol (D 1000) 25 MCG (1000 UT) capsule Take 25 mcg by mouth daily. 07/26/20  Yes [provider]  dexamethasone (DECADRON) 0.1 % ophthalmic solution Place 1 drop into the right eye daily. 08/06/23  Yes [provider]  fluticasone (FLONASE) 50 MCG/ACT nasal spray Place 1 spray into both nostrils 2 (two)  times daily. 09/05/23  Yes [provider]  loratadine (CLARITIN) 10 MG tablet Take 10 mg by mouth daily. 09/05/23  Yes [provider]  Lutein 20 MG CAPS Take 20 mg by mouth daily at 2 PM. 07/26/20  Yes [provider]  moxifloxacin (VIGAMOX) 0.5 % ophthalmic solution Place 1 drop into the left eye 3 (three) times daily. 05/27/23  Yes [provider]   oxyCODONE-acetaminophen (PERCOCET/ROXICET) 5-325 MG tablet Take 1-2 tablets by mouth every 12 (twelve) hours. 07/15/23  Yes [provider]  Polyvinyl Alcohol-Povidone PF 1.4-0.6 % SOLN 1 tablet Ophthalmic once a day 07/26/20  Yes [provider]  Rosuvastatin Calcium 10 MG CPSP  10/04/21  Yes [provider]  valACYclovir (VALTREX) 1000 MG tablet Take 1 tablet (1,000 mg total) by mouth 3 (three) times daily. 09/28/23  Yes Vernestine Gondola, PA-C  aspirin 81 MG chewable tablet Chew 81 mg by mouth daily.    [provider]  atorvastatin (LIPITOR) 20 MG tablet Take 20 mg by mouth daily.    [provider]  brimonidine (ALPHAGAN) 0.15 % ophthalmic solution Place 1 drop into the right eye 2 (two) times daily.    [provider]  latanoprost (XALATAN) 0.005 % ophthalmic solution Place 1 drop into the right eye at bedtime.    [provider]  meclizine (ANTIVERT) 25 MG tablet Take by mouth. 01/17/20   [provider]  Multiple Vitamins-Minerals (ICAPS AREDS 2 PO) Take 1 mg by mouth daily.    [provider]  Omega-3 Fatty Acids (FISH OIL) 1000 MG CAPS Take 1,000 mg by mouth daily.    [provider]  omeprazole (PRILOSEC) 20 MG capsule Take 20 mg by mouth daily.    [provider]  prednisoLONE acetate (PRED FORTE) 1 % ophthalmic suspension Place 1 drop into the right eye 2 (two) times daily. 01/03/20   [provider]    Family History Family History  Problem Relation Age of Onset   Breast cancer Neg Hx     Social History Social History   Tobacco Use   Smoking status: Never   Smokeless tobacco: Never  Vaping Use   Vaping status: Never Used  Substance Use Topics   Alcohol use: No   Drug use: No     Allergies   Codeine, Ciprofloxacin, and Other   Review of Systems Review of Systems  Constitutional:  Negative for chills and fever.  Eyes:  Negative for discharge and redness.   Respiratory:  Negative for shortness of breath.   Gastrointestinal:  Negative for abdominal pain, nausea and vomiting.  Skin:  Positive for rash.     Physical Exam Triage Vital Signs ED Triage Vitals  Encounter Vitals Group     BP 09/28/23 1217 130/89     Systolic BP Percentile --      Diastolic BP Percentile --      Pulse Rate 09/28/23 1217 94     Resp 09/28/23 1217 18     Temp 09/28/23 1217 97.8 F (36.6 C)     Temp Source 09/28/23 1217 Oral     SpO2 09/28/23 1217 97 %     Weight 09/28/23 1213 151 lb (68.5 kg)     Height 09/28/23 1213 5' 4.5" (1.638 m)     Head Circumference --      Peak Flow --      Pain Score 09/28/23 1210 2     Pain Loc --  Pain Education --      Exclude from Growth Chart --    No data found.  Updated Vital Signs BP 130/89 (BP Location: Right Arm)   Pulse 94   Temp 97.8 F (36.6 C) (Oral)   Resp 18   Ht 5' 4.5" (1.638 m)   Wt 151 lb (68.5 kg)   SpO2 97%   BMI 25.52 kg/m   Visual Acuity Right Eye Distance:   Left Eye Distance:   Bilateral Distance:    Right Eye Near:   Left Eye Near:    Bilateral Near:     Physical Exam Vitals and nursing note reviewed.  Constitutional:      General: She is not in acute distress.    Appearance: Normal appearance. She is not ill-appearing.  HENT:     Head: Normocephalic and atraumatic.  Eyes:     Conjunctiva/sclera: Conjunctivae normal.  Cardiovascular:     Rate and Rhythm: Normal rate.  Pulmonary:     Effort: Pulmonary effort is normal. No respiratory distress.  Skin:    Comments: Mildly erythematous vesicular cluster of lesions to posterior right calf area  Neurological:     Mental Status: She is alert.  Psychiatric:        Mood and Affect: Mood normal.        Behavior: Behavior normal.        Thought Content: Thought content normal.      UC Treatments / Results  Labs (all labs ordered are listed, but only abnormal results are displayed) Labs Reviewed - No data to  display  EKG   Radiology No results found.  Procedures Procedures (including critical care time)  Medications Ordered in UC Medications - No data to display  Initial Impression / Assessment and Plan / UC Course  I have reviewed the triage vital signs and the nursing notes.  Pertinent labs & imaging results that were available during my care of the patient were reviewed by me and considered in my medical decision making (see chart for details).    Given appearance and report of burning sensation I am most concerned for herpes zoster.  Will treat with Valtrex but did encourage topical use of hydrocortisone to hopefully help with itching as well.  Encouraged symptomatic treatment otherwise and recommended follow-up if no improvement or with any worsening.  Final Clinical Impressions(s) / UC Diagnoses   Final diagnoses:  Herpes zoster without complication   Discharge Instructions   None    ED Prescriptions     Medication Sig Dispense Auth. Provider   valACYclovir (VALTREX) 1000 MG tablet Take 1 tablet (1,000 mg total) by mouth 3 (three) times daily. 21 tablet Vernestine Gondola, PA-C      PDMP not reviewed this encounter.   Vernestine Gondola, PA-C 09/28/23 [phone]

## 2023-10-08 ENCOUNTER — Ambulatory Visit
Admission: RE | Admit: 2023-10-08 | Discharge: 2023-10-08 | Disposition: A | Discharge status: Home / Self Care | Source: Ambulatory Visit | Attending: Internal Medicine | Admitting: Internal Medicine

## 2023-10-08 DIAGNOSIS — Z1231 Encounter for screening mammogram for malignant neoplasm of breast: Secondary | ICD-10-CM

## 2023-11-04 ENCOUNTER — Ambulatory Visit (INDEPENDENT_AMBULATORY_CARE_PROVIDER_SITE_OTHER): Admitting: Podiatry

## 2023-11-04 ENCOUNTER — Ambulatory Visit (INDEPENDENT_AMBULATORY_CARE_PROVIDER_SITE_OTHER)

## 2023-11-04 ENCOUNTER — Encounter: Payer: Self-pay | Admitting: Podiatry

## 2023-11-04 VITALS — BP 126/74 | HR 97 | Temp 97.6°F | Resp 18 | Ht 64.5 in | Wt 151.0 lb

## 2023-11-04 DIAGNOSIS — M21611 Bunion of right foot: Secondary | ICD-10-CM

## 2023-11-04 DIAGNOSIS — M21612 Bunion of left foot: Secondary | ICD-10-CM

## 2023-11-04 DIAGNOSIS — M2041 Other hammer toe(s) (acquired), right foot: Secondary | ICD-10-CM | POA: Diagnosis not present

## 2023-11-04 DIAGNOSIS — R0989 Other specified symptoms and signs involving the circulatory and respiratory systems: Secondary | ICD-10-CM | POA: Diagnosis not present

## 2023-11-04 NOTE — Patient Instructions (Signed)

## 2023-11-06 ENCOUNTER — Telehealth: Payer: Self-pay | Admitting: Podiatry

## 2023-11-06 NOTE — Telephone Encounter (Signed)
 Received surgical auth forms  Called pt and she is wanting to call back to schedule the surgery. She has a few other appts she has to get taken care of first. She was thinking to have the surgery end of August beginning of sept.

## 2023-11-08 NOTE — Progress Notes (Signed)
 Subjective:  Patient ID: Dana Randall, female    DOB: [birth year],  MRN: [id number]  Chief Complaint  Patient presents with   Bunions    new patient-second opinion for bilateral hammertoe and bunion issues    Discussed the use of AI scribe software for clinical note transcription with the patient, who gave verbal consent to proceed.  History of Present Illness Dana Randall is a 73 year old female who presents for evaluation and management of foot pain.  She experiences bunions, hammer toes, and calluses, with the right foot being more problematic. The bunion and second toe are the main sources of discomfort, while the third, fourth, and fifth toes are not bothersome. Calluses are managed with topical acid treatment and medicated pads, which effectively reduce pain. A night device is used for toe separation, providing some relief. Despite these measures, the hammer toes have become rigid and arthritic, and the bunions remain painful, especially on the right foot. No recent treatments have been pursued for bunions or hammer toes beyond self-care.      Objective:    Physical Exam General: AAO x3, NAD  Dermatological: No open lesions are present bilaterally.  Vascular: DP pulses 2/4, PT pulse 1/4.  Immediate capillary fill time. Pedal hair growth present. No varicosities and no lower extremity edema present bilateral. There is no pain with calf compression, swelling, warmth, erythema.   Neruologic: Grossly intact via light touch bilateral.  Musculoskeletal: Moderate bunions present bilaterally.  There is no pain or crepitation with MPJ range of motion.  Digital contracture present on second digit.  Tailor's bunion is also noted.  No areas of pinpoint tenderness otherwise.  MMT 5/5.  Gait: Unassisted, Nonantalgic.     No images are attached to the encounter.    Results RADIOLOGY Foot X-ray: Bunion deformities present bilaterally with intermetatarsal angle of approximately  17 degrees on the right side.  Digital contractures also present.  Calcaneal spurring present.  Osteopenia.   Assessment:   1. Bilateral bunions   2. Decreased pedal pulses   3. Hammertoe of right foot      Plan:  Patient was evaluated and treated and all questions answered.  Assessment and Plan Assessment & Plan Bunion/hammertoe deformity, right foot Chronic bunion with medial deviation of the first metatarsal and lateral leaning of the big toe. Non-surgical options ineffective. - Further discussed surgical versus conservative treatments. - At this time she was to proceed with surgical intervention.  Discussed with her different options surgically.  Like to proceed with Massie Croak bunionectomy.  She does not want to have any surgery done for the tailor's bunions at this time. - The incision placement as well as the postoperative course was discussed with the patient. I discussed risks of the surgery which include, but not limited to, infection, bleeding, pain, swelling, need for further surgery, delayed or nonhealing, painful or ugly scar, numbness or sensation changes, over/under correction, recurrence, transfer lesions, further deformity, hardware failure, DVT/PE, loss of toe/foot. Patient understands these risks and wishes to proceed with surgery. The surgical consent was reviewed with the patient all 3 pages were signed. No promises or guarantees were given to the outcome of the procedure. All questions were answered to the best of my ability. Before the surgery the patient was encouraged to call the office if there is any further questions. The surgery will be performed at the [facility] on an outpatient basis. - ABI ordered to assess circulation preoperatively.  No follow-ups on file.  Donnice JONELLE Fees DPM

## 2023-11-22 ENCOUNTER — Ambulatory Visit
Admission: RE | Admit: 2023-11-22 | Discharge: 2023-11-22 | Disposition: A | Discharge status: Home / Self Care | Source: Ambulatory Visit

## 2023-11-22 ENCOUNTER — Ambulatory Visit (INDEPENDENT_AMBULATORY_CARE_PROVIDER_SITE_OTHER)

## 2023-11-22 VITALS — BP 125/82 | HR 88 | Temp 98.5°F | Resp 20 | Ht 64.0 in | Wt 150.0 lb

## 2023-11-22 DIAGNOSIS — R0602 Shortness of breath: Secondary | ICD-10-CM

## 2023-11-22 DIAGNOSIS — J069 Acute upper respiratory infection, unspecified: Secondary | ICD-10-CM

## 2023-11-22 DIAGNOSIS — M179 Osteoarthritis of knee, unspecified: Secondary | ICD-10-CM | POA: Insufficient documentation

## 2023-11-22 MED ORDER — AZITHROMYCIN 250 MG PO TABS: 250.0000 mg | ORAL_TABLET | Freq: Every day | ORAL | 0 refills | Status: AC

## 2023-11-22 MED ORDER — ALBUTEROL SULFATE HFA 108 (90 BASE) MCG/ACT IN AERS: 1.0000 | INHALATION_SPRAY | Freq: Four times a day (QID) | RESPIRATORY_TRACT | 0 refills | Status: AC | PRN

## 2023-11-22 MED ORDER — BENZONATATE 100 MG PO CAPS: 100.0000 mg | ORAL_CAPSULE | Freq: Three times a day (TID) | ORAL | 0 refills | Status: AC

## 2023-11-22 NOTE — ED Provider Notes (Signed)
 [facility]    CSN: [id number] Arrival date & time: 11/22/23  1344      History   Chief Complaint Chief Complaint  Patient presents with   Cough    Chest congestion - Entered by patient    HPI Dana Randall is a 73 y.o. female.   73 y.o. female who presents to urgent care with complaints of congestion, sore throat, ear pain, cough and shortness of breath.  She reports she has had congestion in her nose for quite some time however over the last 3 to 4 days she has developed ear pain, shortness of breath and sore throat.  She is even having difficulty swallowing due to this.  She reports that she has had a lot of drainage down the back of her nose into her throat.  She reports that the shortness of breath is worse with activity.  She denies any fevers, chills, chest pain, nausea, vomiting, abdominal pain   Cough Associated symptoms: ear pain, rhinorrhea, shortness of breath and sore throat   Associated symptoms: no chest pain, no chills, no fever and no rash     History reviewed. No pertinent past medical history.  Patient Active Problem List   Diagnosis Date Noted   Osteoarthritis of knee 11/22/2023   History of melanoma 09/28/2023   Glaucoma 09/28/2023   DDD (degenerative disc disease), cervical 09/28/2023   Benign paroxysmal positional vertigo 09/28/2023   Arthritis of right knee 09/28/2023   Arthritis of both knees 09/28/2023   Allergic rhinitis 09/28/2023   Impaired fasting glucose 09/28/2023   Obesity 09/28/2023   Macular degeneration 09/28/2023   Other chronic pain 09/28/2023   Primary open angle glaucoma (POAG) of right eye, severe stage 09/28/2023   Primary osteoarthritis of both knees 09/28/2023   Exudative age-related macular degeneration of right eye with active choroidal neovascularization (HCC) 05/02/2021   Vitreomacular traction syndrome, 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   Posterior capsular opacification, left eye 03/01/2021   Macular pucker, left eye  10/19/2020   Tinnitus of left ear 07/26/2020   Sensorineural hearing loss (SNHL) of both ears 07/26/2020   Early stage nonexudative age-related macular degeneration of right eye 04/19/2020   Stromal keratitis, right 01/19/2020   Exudative age-related macular degeneration of left eye with active choroidal neovascularization (HCC) 08/11/2019   Advanced nonexudative age-related macular degeneration of left eye with subfoveal involvement 08/11/2019   Retinal hemorrhage of left eye 08/11/2019   Hypercholesterolemia 09/23/2016   Malignant melanoma (HCC) 12/13/2014    Past Surgical History:  Procedure Laterality Date   CATARACT EXTRACTION Right 2021   Dr. Charmayne   CATARACT EXTRACTION Left 2020   Dr. Charmayne    OB History   No obstetric history on file.      Home Medications    Prior to Admission medications   Medication Sig Start Date End Date Taking? Authorizing Provider  albuterol  (VENTOLIN  HFA) 108 (90 Base) MCG/ACT inhaler Inhale 1-2 puffs into the lungs every 6 (six) hours as needed for wheezing or shortness of breath. 11/22/23  Yes Mose Colaizzi A, PA-C  augmented betamethasone dipropionate (DIPROLENE-AF) 0.05 % cream Apply topically daily. 11/12/23  Yes [provider]  azithromycin  (ZITHROMAX ) 250 MG tablet Take 1 tablet (250 mg total) by mouth daily. Take first 2 tablets together, then 1 every day until finished. 11/22/23  Yes Aquila Delaughter A, PA-C  benzonatate  (TESSALON ) 100 MG capsule Take 1 capsule (100 mg total) by mouth every 8 (eight) hours. 11/22/23  Yes Teresa Norris  A, PA-C  fluticasone (FLONASE) 50 MCG/ACT nasal spray Place 1 spray into both nostrils 2 (two) times daily. 09/05/23  Yes [provider]  loratadine (CLARITIN) 10 MG tablet Take 10 mg by mouth daily. 09/05/23  Yes [provider]  ascorbic acid (VITAMIN C) 1000 MG tablet Take 1,000 mg by mouth daily. 07/26/20   [provider]  bimatoprost (LUMIGAN) 0.01 % SOLN Place 1 drop  into both eyes at bedtime. 11/11/11   [provider]  brimonidine (ALPHAGAN P) 0.1 % SOLN Place into both eyes every 8 (eight) hours. 11/08/13   [provider]  brimonidine (ALPHAGAN) 0.15 % ophthalmic solution Place 1 drop into the right eye 2 (two) times daily.    [provider]  Cholecalciferol (D 1000) 25 MCG (1000 UT) capsule Take 25 mcg by mouth daily. 07/26/20   [provider]  latanoprost (XALATAN) 0.005 % ophthalmic solution Place 1 drop into the right eye at bedtime.    [provider]  Lutein 20 MG CAPS Take 20 mg by mouth daily at 2 PM. 07/26/20   [provider]  Multiple Vitamins-Minerals (ICAPS AREDS 2 PO) Take 1 mg by mouth daily.    [provider]  Polyvinyl Alcohol-Povidone PF 1.4-0.6 % SOLN 1 tablet Ophthalmic once a day 07/26/20   [provider]  Rosuvastatin Calcium 10 MG CPSP  10/04/21   [provider]    Family History Family History  Problem Relation Age of Onset   Breast cancer Neg Hx     Social History Social History   Tobacco Use   Smoking status: Never   Smokeless tobacco: Never  Vaping Use   Vaping status: Never Used  Substance Use Topics   Alcohol use: No   Drug use: No     Allergies   Codeine and Ciprofloxacin   Review of Systems Review of Systems  Constitutional:  Negative for chills and fever.  HENT:  Positive for ear pain, rhinorrhea, sore throat and trouble swallowing.   Eyes:  Negative for pain and visual disturbance.  Respiratory:  Positive for cough and shortness of breath.   Cardiovascular:  Negative for chest pain and palpitations.  Gastrointestinal:  Negative for abdominal pain and vomiting.  Genitourinary:  Negative for dysuria and hematuria.  Musculoskeletal:  Negative for arthralgias and back pain.  Skin:  Negative for color change and rash.  Neurological:  Negative for seizures and syncope.  All other systems reviewed and are  negative.    Physical Exam Triage Vital Signs ED Triage Vitals  Encounter Vitals Group     BP 11/22/23 1353 125/82     Girls Systolic BP Percentile --      Girls Diastolic BP Percentile --      Boys Systolic BP Percentile --      Boys Diastolic BP Percentile --      Pulse Rate 11/22/23 1353 88     Resp 11/22/23 1353 20     Temp 11/22/23 1353 98.5 F (36.9 C)     Temp Source 11/22/23 1353 Oral     SpO2 11/22/23 1353 96 %     Weight 11/22/23 1350 150 lb (68 kg)     Height 11/22/23 1350 5' 4 (1.626 m)     Head Circumference --      Peak Flow --      Pain Score 11/22/23 1347 0     Pain Loc --      Pain Education --  Exclude from Growth Chart --    No data found.  Updated Vital Signs BP 125/82 (BP Location: Left Arm)   Pulse 88   Temp 98.5 F (36.9 C) (Oral)   Resp 20   Ht 5' 4 (1.626 m)   Wt 150 lb (68 kg)   SpO2 96%   BMI 25.75 kg/m   Visual Acuity Right Eye Distance:   Left Eye Distance:   Bilateral Distance:    Right Eye Near:   Left Eye Near:    Bilateral Near:     Physical Exam Vitals and nursing note reviewed.  Constitutional:      General: She is not in acute distress.    Appearance: She is well-developed.  HENT:     Head: Normocephalic and atraumatic.     Right Ear: Tympanic membrane normal.     Left Ear: Tympanic membrane normal.     Nose: Congestion present.     Mouth/Throat:     Pharynx: Posterior oropharyngeal erythema present.  Eyes:     Conjunctiva/sclera: Conjunctivae normal.  Cardiovascular:     Rate and Rhythm: Normal rate and regular rhythm.     Heart sounds: No murmur heard. Pulmonary:     Effort: Pulmonary effort is normal. No respiratory distress.     Breath sounds: Examination of the right-upper field reveals rhonchi. Examination of the right-middle field reveals rhonchi. Rhonchi present. No decreased breath sounds or wheezing.  Abdominal:     Palpations: Abdomen is soft.     Tenderness: There is no abdominal tenderness.   Musculoskeletal:        General: No swelling.     Cervical back: Neck supple.  Skin:    General: Skin is warm and dry.     Capillary Refill: Capillary refill takes less than 2 seconds.  Neurological:     General: No focal deficit present.     Mental Status: She is alert.  Psychiatric:        Mood and Affect: Mood normal.      UC Treatments / Results  Labs (all labs ordered are listed, but only abnormal results are displayed) Labs Reviewed - No data to display  EKG   Radiology DG Chest 2 View Result Date: 11/22/2023 CLINICAL DATA:  Shortness of breath, cough. EXAM: CHEST - 2 VIEW COMPARISON:  12/30/2011. FINDINGS: The heart size and mediastinal contours are within normal limits. There is hyperinflation of the lungs with increased AP diameter of the chest a flattening of the diaphragms, compatible with chronic obstructive pulmonary disease. No consolidation, effusion, or pneumothorax is seen. Cervical spinal fusion hardware is noted. There degenerative changes in the thoracic spine. No acute osseous abnormality. IMPRESSION: 1. No active cardiopulmonary disease. 2. Chronic obstructive pulmonary disease. Electronically Signed   By: Leita Birmingham M.D.   On: 11/22/2023 14:20    Procedures Procedures (including critical care time)  Medications Ordered in UC Medications - No data to display  Initial Impression / Assessment and Plan / UC Course  I have reviewed the triage vital signs and the nursing notes.  Pertinent labs & imaging results that were available during my care of the patient were reviewed by me and considered in my medical decision making (see chart for details).     Shortness of breath - Plan: DG Chest 2 View, DG Chest 2 View  Acute upper respiratory infection   Chest x-ray done today.  Final evaluation by the radiologist shows no evidence of acute process, no pneumonia.  Symptoms, physical exam induration of symptoms are most consistent with a upper respiratory  infection.  We will treat this with the following: Azithromycin  250mg  Take 2 tablets today and the 1 tablet daily for 4 more days. Albuterol  inhaler 1-2 puffs every 6 hours as needed for wheezing/shortness of breath. Benzonatate  (tessalon ) 100 mg every 8 hours as needed for cough.   Continue Tylenol or ibuprofen for fever/pain Rest and stay hydrated.  Return to urgent care or PCP if symptoms worsen or fail to resolve.  Final Clinical Impressions(s) / UC Diagnoses   Final diagnoses:  Shortness of breath  Acute upper respiratory infection     Discharge Instructions      Chest x-ray done today.  Final evaluation by the radiologist shows no evidence of acute process, no pneumonia.  Symptoms, physical exam induration of symptoms are most consistent with a upper respiratory infection.  We will treat this with the following: Azithromycin  250mg  Take 2 tablets today and the 1 tablet daily for 4 more days. Albuterol  inhaler 1-2 puffs every 6 hours as needed for wheezing/shortness of breath. Benzonatate  (tessalon ) 100 mg every 8 hours as needed for cough.   Continue Tylenol or ibuprofen for fever/pain Rest and stay hydrated.  Return to urgent care or PCP if symptoms worsen or fail to resolve.       ED Prescriptions     Medication Sig Dispense Auth. Provider   azithromycin  (ZITHROMAX ) 250 MG tablet Take 1 tablet (250 mg total) by mouth daily. Take first 2 tablets together, then 1 every day until finished. 6 tablet Teresa Norris A, PA-C   albuterol  (VENTOLIN  HFA) 108 (90 Base) MCG/ACT inhaler Inhale 1-2 puffs into the lungs every 6 (six) hours as needed for wheezing or shortness of breath. 6.7 g Dillon Mcreynolds A, PA-C   benzonatate  (TESSALON ) 100 MG capsule Take 1 capsule (100 mg total) by mouth every 8 (eight) hours. 21 capsule Teresa Norris LABOR, [place]      PDMP not reviewed this encounter.   Teresa Norris LABOR, [place] 11/22/23 1427

## 2023-11-22 NOTE — ED Triage Notes (Signed)
 I have been having a runny nose (for quite a while) and everything else starting Wednesday with congestion, that has moved to my ears, throat, and now a lot of congestion in my chest, I have taken lots of different OTC meds and everything is getting worse not better, I am concerned with my breathing.

## 2023-11-22 NOTE — Discharge Instructions (Addendum)
 Chest x-ray done today.  Final evaluation by the radiologist shows no evidence of acute process, no pneumonia.  Symptoms, physical exam induration of symptoms are most consistent with a upper respiratory infection.  We will treat this with the following: Azithromycin  250mg  Take 2 tablets today and the 1 tablet daily for 4 more days. Albuterol  inhaler 1-2 puffs every 6 hours as needed for wheezing/shortness of breath. Benzonatate  (tessalon ) 100 mg every 8 hours as needed for cough.   Continue Tylenol or ibuprofen for fever/pain Rest and stay hydrated.  Return to urgent care or PCP if symptoms worsen or fail to resolve.

## 2023-11-27 ENCOUNTER — Encounter (HOSPITAL_COMMUNITY)

## 2023-12-01 ENCOUNTER — Ambulatory Visit (HOSPITAL_COMMUNITY)
Admission: RE | Admit: 2023-12-01 | Discharge: 2023-12-01 | Disposition: A | Discharge status: Home / Self Care | Source: Ambulatory Visit | Attending: Podiatry | Admitting: Podiatry

## 2023-12-01 ENCOUNTER — Ambulatory Visit: Payer: Self-pay | Admitting: Podiatry

## 2023-12-01 DIAGNOSIS — R0989 Other specified symptoms and signs involving the circulatory and respiratory systems: Secondary | ICD-10-CM | POA: Diagnosis not present

## 2023-12-01 LAB — VAS US ABI WITH/WO TBI
Left ABI: 1.04
Right ABI: 1.08

## 2023-12-05 ENCOUNTER — Telehealth: Payer: Self-pay | Admitting: Podiatry

## 2023-12-05 NOTE — Telephone Encounter (Signed)
 Pt called back and is deciding between 9/17 and 9/24 for her surgery. She is calling another office to see if she can move an appt she has scheduled for 9/17 and will let me know next week.

## 2023-12-08 ENCOUNTER — Telehealth: Payer: Self-pay | Admitting: Podiatry

## 2023-12-08 NOTE — Telephone Encounter (Signed)
 Pt has a walker at home and was asking if that would be ok to use or should she get crutches for the surgery? She did not get a boot at the appt and was asking and I told her she would get it at the surgery center and she was asking if it was a hard boot and I told her yes.   Also she cannot sleep on her back and is asking if she could try propping her leg up while laying on her side if that would be ok?    She is scheduled for surgery on 01/14/24

## 2023-12-08 NOTE — Telephone Encounter (Signed)
 Left message for pt letting her know what Dr Gershon said and to call if any further questions.

## 2024-01-14 ENCOUNTER — Other Ambulatory Visit: Payer: Self-pay | Admitting: Podiatry

## 2024-01-14 DIAGNOSIS — M2041 Other hammer toe(s) (acquired), right foot: Secondary | ICD-10-CM | POA: Diagnosis not present

## 2024-01-14 DIAGNOSIS — M2011 Hallux valgus (acquired), right foot: Secondary | ICD-10-CM | POA: Diagnosis not present

## 2024-01-14 MED ORDER — OXYCODONE-ACETAMINOPHEN 5-325 MG PO TABS: 1.0000 | ORAL_TABLET | Freq: Four times a day (QID) | ORAL | 0 refills | Status: AC | PRN

## 2024-01-14 MED ORDER — CEPHALEXIN 500 MG PO CAPS: 500.0000 mg | ORAL_CAPSULE | Freq: Three times a day (TID) | ORAL | 0 refills | Status: AC

## 2024-01-14 MED ORDER — PROMETHAZINE HCL 25 MG PO TABS: 25.0000 mg | ORAL_TABLET | Freq: Three times a day (TID) | ORAL | 0 refills | Status: AC | PRN

## 2024-01-16 ENCOUNTER — Telehealth: Payer: Self-pay | Admitting: Podiatry

## 2024-01-16 NOTE — Telephone Encounter (Signed)
 I called the patient on 9/25 at 5pm check on her postoperatively.  States that she is having some pain.  No fevers or chills.  Has been trying keep her foot elevated.  She did look at the toe earlier than it looks a little bruised but it does have a pink color to the second toe.  Encouraged her to continue to monitor and she notices any dark discoloration or white discoloration to the nail immediately.  She verbalized understanding.  Her husband was also present on the call. No further questions or concerns.

## 2024-01-19 ENCOUNTER — Ambulatory Visit (INDEPENDENT_AMBULATORY_CARE_PROVIDER_SITE_OTHER)

## 2024-01-19 ENCOUNTER — Ambulatory Visit (INDEPENDENT_AMBULATORY_CARE_PROVIDER_SITE_OTHER): Admitting: Podiatry

## 2024-01-19 DIAGNOSIS — M2041 Other hammer toe(s) (acquired), right foot: Secondary | ICD-10-CM

## 2024-01-19 DIAGNOSIS — M21611 Bunion of right foot: Secondary | ICD-10-CM

## 2024-01-19 DIAGNOSIS — M21612 Bunion of left foot: Secondary | ICD-10-CM

## 2024-01-19 NOTE — Progress Notes (Signed)
 Subjective: Chief Complaint  Patient presents with   Routine Post Op    POV # 1 DOS 01/14/24 RT FOOT BUNIONCORRECTION( [facility]/[place]), RT 2ND DIGIT HAMMERTOE REPAIR.  Pain is better just some pain off and on in great and 2nd toe. no pain pills the last 3 days only NSAID. Still finishing antibiotics.  Not diabetic no anti coag   73 year old female presents the office today with her husband for follow-up evaluation status post right foot bunionectomy, hammertoe repair of the second digit.  She has been feeling well and she denies any fevers or chills.  She has had good color to the second toe and her husband's been checking it.  No other concerns.  Objective: AAO x3, NAD DP/PT pulses palpable bilaterally, CRT less than 3 seconds to all digits Incision well coapted with sutures intact.  There is some mild bruising present most of the second toe.  There is no drainage or pus or any signs of infection there is no increased temperature.  There is no signs of infection noted on the surgical sites today.  Slight discomfort on the surgical sites.  Toes are rectus. No pain with calf compression, swelling, warmth, erythema  Assessment: Status post bunionectomy, hammertoe repair  Plan: -All treatment options discussed with the patient including all alternatives, risks, complications.  -X-rays were obtained reviewed.  Multiple views obtained.  Status post bunionectomy as well as hammertoe repair.  Is no evidence of acute fracture or complicating factors at this time. -Dressing was reapplied after I cleaned the skin.  Xeroform was applied followed by dressing.  She keep dressing clean, dry, intact until follow-up. -Wearing cam boot with limited weightbearing, elevation. -Pain medication as needed -Monitor for any clinical signs or symptoms of infection and directed to call the office immediately should any occur or go to the ER.  No follow-ups on file.  Donnice JONELLE Fees DPM

## 2024-01-29 ENCOUNTER — Encounter: Payer: Self-pay | Admitting: Podiatry

## 2024-01-29 ENCOUNTER — Ambulatory Visit (INDEPENDENT_AMBULATORY_CARE_PROVIDER_SITE_OTHER): Admitting: Podiatry

## 2024-01-29 VITALS — Ht 64.0 in | Wt 150.0 lb

## 2024-01-29 DIAGNOSIS — M21612 Bunion of left foot: Secondary | ICD-10-CM

## 2024-01-29 DIAGNOSIS — M21611 Bunion of right foot: Secondary | ICD-10-CM

## 2024-01-29 DIAGNOSIS — M2041 Other hammer toe(s) (acquired), right foot: Secondary | ICD-10-CM

## 2024-01-29 NOTE — Progress Notes (Signed)
 Subjective: Chief Complaint  Patient presents with   Routine Post Op     POV # 2 DOS 01/14/24 RT FOOT BUNIONCORRECTION( [facility]/[place]), RT 2ND DIGIT HAMMERTOE REPAIR, pt is here to f/u on the right foot after surgery, she states everything is going well, still some pain but manageable, no other complaints.     73 year old female presents the office today with her husband for follow-up evaluation status post right foot bunionectomy, hammertoe repair of the second digit.  She presents today for possible suture removal.  She denies any fevers or chills and her pain is controlled.  She been walking in the cam boot.  She has no concerns otherwise today.     Objective: AAO x3, NAD DP/PT pulses palpable bilaterally, CRT less than 3 seconds to all digits Incision well coapted with sutures intact.  There is 1 small blister which has very minimal clear drainage expressed on the distal lateral aspect of the second toe.  Is no purulence.  There is no cellulitis identified.  There is no drainage or pus coming from the K wire.  There is mild edema to the surgical site but appears to be improved compared to what it was previously.  There is no significant edema at the surgical sites.  Toes are rectus. No pain with calf compression, swelling, warmth, erythema  Assessment: Status post bunionectomy, hammertoe repair  Plan: -All treatment options discussed with the patient including all alternatives, risks, complications.  -Sutures removed today without complications.  Betadine applied to the area.  Followed by dressing.  Discussed the dressing changes but she does not feel comfortable doing this so we will see her back next week for dressing change. -Remain in cam boot.  Dispensed surgical shoe for immobilization at nighttime.  When she is walking recommend the surgical boot. -Continue to ice and elevate. -Monitor for any clinical signs or symptoms of infection and directed to call the office immediately should  any occur or go to the ER.  RTC 1 week for dressing change  Donnice JONELLE Fees DPM

## 2024-02-05 ENCOUNTER — Ambulatory Visit: Admitting: Podiatry

## 2024-02-05 DIAGNOSIS — M2041 Other hammer toe(s) (acquired), right foot: Secondary | ICD-10-CM

## 2024-02-05 DIAGNOSIS — M21611 Bunion of right foot: Secondary | ICD-10-CM

## 2024-02-05 DIAGNOSIS — M21612 Bunion of left foot: Secondary | ICD-10-CM

## 2024-02-05 NOTE — Progress Notes (Signed)
 Patient presents for dressing change today. Pin is still in place. Some bruising present on the right 2nd toe. Dressing intact from visit last week, still CDI. Replaced with DSD and AFW replaced. Patient has follow up already scheduled and will call back with questions or concerns.

## 2024-02-12 ENCOUNTER — Ambulatory Visit (INDEPENDENT_AMBULATORY_CARE_PROVIDER_SITE_OTHER)

## 2024-02-12 ENCOUNTER — Ambulatory Visit (INDEPENDENT_AMBULATORY_CARE_PROVIDER_SITE_OTHER): Admitting: Podiatry

## 2024-02-12 DIAGNOSIS — M2041 Other hammer toe(s) (acquired), right foot: Secondary | ICD-10-CM

## 2024-02-12 DIAGNOSIS — M21611 Bunion of right foot: Secondary | ICD-10-CM

## 2024-02-12 DIAGNOSIS — M21612 Bunion of left foot: Secondary | ICD-10-CM

## 2024-02-12 NOTE — Progress Notes (Signed)
 Subjective: Chief Complaint  Patient presents with   Routine Post Op    POV # 3 DOS 01/14/24 RT FOOT BUNIONCORRECTION( [facility]/[place]), RT 2ND DIGIT HAMMERTOE REPAIR, pt states everything is going well, has no complaints. Foot looks great, no drainage.    73 year old female presents the office today with her husband for follow-up evaluation status post right foot bunionectomy, hammertoe repair of the second digit.  States that she has been doing well she denies any pain.  Does not report any injuries.  She has no other concerns.  No fevers or chills.  Objective: AAO x3, NAD DP/PT pulses palpable bilaterally, CRT less than 3 seconds to all digits Incision well coapted with sutures intact.  There is no blister formation today.  There is no surrounding erythema, increase in size.  There is no drainage or pus or any obvious signs of infection today.  Toe clinically appears to be rectus.  K wire intact but any drainage or pus. No pain with calf compression, swelling, warmth, erythema  Assessment: Status post bunionectomy, hammertoe repair  Plan: -All treatment options discussed with the patient including all alternatives, risks, complications.  -X-rays obtained reviewed.  Hardware intact uncomplicated factors.  No evidence of acute fracture. -Plan leave the K wire in for 2 more weeks.  Dressing reapplied.  I showed her how to change the dressing at home. -You can do it for now.  Ice, elevation.  Close compression to help with edema, although controlled.  -Monitor for any clinical signs or symptoms of infection and directed to call the office immediately should any occur or go to the ER.  Return in about 2 weeks (around 02/26/2024). X-ray next appointment   Donnice JONELLE Fees DPM

## 2024-02-27 ENCOUNTER — Encounter: Payer: Self-pay | Admitting: Podiatry

## 2024-02-27 ENCOUNTER — Ambulatory Visit: Admitting: Podiatry

## 2024-02-27 ENCOUNTER — Ambulatory Visit (INDEPENDENT_AMBULATORY_CARE_PROVIDER_SITE_OTHER)

## 2024-02-27 DIAGNOSIS — Z9889 Other specified postprocedural states: Secondary | ICD-10-CM

## 2024-02-27 DIAGNOSIS — M21619 Bunion of unspecified foot: Secondary | ICD-10-CM

## 2024-02-27 DIAGNOSIS — M2041 Other hammer toe(s) (acquired), right foot: Secondary | ICD-10-CM

## 2024-02-27 NOTE — Progress Notes (Signed)
 Subjective: Chief Complaint  Patient presents with   Routine Post Op    POV # 4 DOS 01/14/24 RT FOOT BUNIONCORRECTION( [facility]/[place]), RT 2ND DIGIT HAMMERTOE REPAIR. 0 pain at the present. Non diabetic. Wearing pneumatic cast.    73 year old female presents the office today with her husband for follow-up evaluation status post right foot bunionectomy, hammertoe repair of the second digit.  She presents today for pin removal.  States that she is doing well and is not having any significant pain.  No fevers or chills.  No concerns today, other than her foot being dry.  Objective: AAO x3, NAD DP/PT pulses palpable bilaterally, CRT less than 3 seconds to all digits Incision well coapted without any evidence of dehiscence.  There is no surrounding erythema, ascending cellulitis but there is no drainage or pus or signs of infection.  K wire intact to the second toe but any signs of infection.  There is no tenderness on the surgical site. No pain with calf compression, swelling, warmth, erythema  Assessment: Status post bunionectomy, hammertoe repair  Plan: -All treatment options discussed with the patient including all alternatives, risks, complications.  -X-rays obtained reviewed.  Hardware intact uncomplicated factors.  No evidence of acute fracture noted today. -K wire removed in total today.  Antibiotic ointment was applied followed by bandage.  Discussed that she can start to wash the foot with soap and water tomorrow and apply bandage.  She can start to transition to a surgical shoe which she has.  Continue to ice, elevate.  Discussed that she can gradually start to transition to a shoe as tolerated in about a week.  Offered formal physical therapy but she has not tried to this on her own at home.  If she changes her mind I will be happy to place referral.  RTC 3 weeks or sooner if needed. X-ray next appointment   Dana Randall DPM  -Plan leave the K wire in for 2 more weeks.  Dressing  reapplied.  I showed her how to change the dressing at home. -You can do it for now.  Ice, elevation.  Close compression to help with edema, although controlled.  -Monitor for any clinical signs or symptoms of infection and directed to call the office immediately should any occur or go to the ER.  Return in about 2 weeks (around 02/26/2024). X-ray next appointment   Dana Randall DPM

## 2024-03-15 ENCOUNTER — Ambulatory Visit (INDEPENDENT_AMBULATORY_CARE_PROVIDER_SITE_OTHER): Admitting: Podiatry

## 2024-03-15 ENCOUNTER — Ambulatory Visit

## 2024-03-15 DIAGNOSIS — M21619 Bunion of unspecified foot: Secondary | ICD-10-CM

## 2024-03-15 DIAGNOSIS — M21611 Bunion of right foot: Secondary | ICD-10-CM | POA: Diagnosis not present

## 2024-03-15 DIAGNOSIS — M2041 Other hammer toe(s) (acquired), right foot: Secondary | ICD-10-CM

## 2024-03-15 NOTE — Patient Instructions (Signed)

## 2024-03-15 NOTE — Progress Notes (Unsigned)
 Walking in yeard with dog

## 2024-05-17 ENCOUNTER — Ambulatory Visit: Admitting: Podiatry
